# Patient Record
Sex: Male | Born: 1990 | Race: Black or African American | Hispanic: No | Marital: Single | Smoking: Never smoker
Health system: Southern US, Community
[De-identification: ages and names within clinical notes are randomized; demographics above are authoritative.]

## PROBLEM LIST (undated history)

## (undated) DIAGNOSIS — I514 Myocarditis, unspecified: Secondary | ICD-10-CM

## (undated) DIAGNOSIS — M109 Gout, unspecified: Secondary | ICD-10-CM

---

## 2020-09-23 ENCOUNTER — Emergency Department
Admission: EM | Admit: 2020-09-23 | Discharge: 2020-09-23 | Disposition: A | Discharge status: Home / Self Care | Payer: Medicaid - Out of State | Attending: Emergency Medicine | Admitting: Emergency Medicine

## 2020-09-23 ENCOUNTER — Encounter: Payer: Self-pay | Admitting: Emergency Medicine

## 2020-09-23 ENCOUNTER — Other Ambulatory Visit: Payer: Self-pay

## 2020-09-23 DIAGNOSIS — M109 Gout, unspecified: Secondary | ICD-10-CM | POA: Diagnosis not present

## 2020-09-23 DIAGNOSIS — M79671 Pain in right foot: Secondary | ICD-10-CM | POA: Diagnosis present

## 2020-09-23 LAB — URIC ACID: Uric Acid, Serum: 7.3 mg/dL (ref 3.7–8.6)

## 2020-09-23 MED ORDER — PREDNISONE 20 MG PO TABS
60.0000 mg | ORAL_TABLET | Freq: Once | ORAL | Status: AC
  Administered 2020-09-23: 60 mg via ORAL
  Filled 2020-09-23: qty 3

## 2020-09-23 MED ORDER — OXYCODONE-ACETAMINOPHEN 5-325 MG PO TABS
1.0000 | ORAL_TABLET | Freq: Once | ORAL | Status: AC
  Administered 2020-09-23: 1 via ORAL
  Filled 2020-09-23: qty 1

## 2020-09-23 MED ORDER — PREDNISONE 10 MG PO TABS: ORAL_TABLET | ORAL | 0 refills | Status: AC

## 2020-09-23 NOTE — ED Provider Notes (Signed)
[facility] Emergency Department Provider Note  ____________________________________________   Event Date/Time   First MD Initiated Contact with Patient 09/23/20 2003     (approximate)  I have reviewed the triage vital signs and the nursing notes.   HISTORY  Chief Complaint Foot Pain  HPI Marcus Atkins is a 30 y.o. male who presents to the emergency department for evaluation of right foot pain and swelling over the last 3 to 4 days.  Patient states that he has known history of gout, reports he has not had a gout attack in a few years.  He has allopurinol available that he has been taking without any improvement.  He reports that the colchicine has been prescribed to him but was too expensive.  He has also tried using the indomethacin that he has available at home without any significant improvement.  He denies any fevers or pain to any other joints.  He reports that this is a similar location of his previous gout attack.  Denies any trauma or falls to the area        History reviewed. No pertinent past medical history.  There are no problems to display for this patient.   History reviewed. No pertinent surgical history.  Prior to Admission medications   Medication Sig Start Date End Date Taking? Authorizing Provider  predniSONE (DELTASONE) 10 MG tablet Take 6 tablets (60 mg total) by mouth daily for 1 day, THEN 5 tablets (50 mg total) daily for 1 day, THEN 4 tablets (40 mg total) daily for 1 day, THEN 3 tablets (30 mg total) daily for 1 day, THEN 2 tablets (20 mg total) daily for 1 day, THEN 1 tablet (10 mg total) daily for 1 day. 09/23/20 09/29/20 Yes Lucy Chris, PA    Allergies Phenergan [promethazine]  No family history on file.  Social History Social History   Tobacco Use  . Smoking status: Never Smoker  . Smokeless tobacco: Never Used  Vaping Use  . Vaping Use: Never used  Substance Use Topics  . Alcohol use: Never  . Drug use: Never     Review of Systems Constitutional: No fever/chills Eyes: No visual changes. ENT: No sore throat. Cardiovascular: Denies chest pain. Respiratory: Denies shortness of breath. Gastrointestinal: No abdominal pain.  No nausea, no vomiting.  No diarrhea.  No constipation. Genitourinary: Negative for dysuria. Musculoskeletal: + Right foot pain and swelling, negative for back pain. Skin: Negative for rash. Neurological: Negative for headaches, focal weakness or numbness.   ____________________________________________   PHYSICAL EXAM:  VITAL SIGNS: ED Triage Vitals  Enc Vitals Group     BP 09/23/20 1916 119/80     Pulse Rate 09/23/20 1916 62     Resp 09/23/20 1916 19     Temp 09/23/20 1916 98.3 F (36.8 C)     Temp Source 09/23/20 1916 Oral     SpO2 09/23/20 1916 99 %     Weight 09/23/20 1908 (!) 320 lb (145.2 kg)     Height 09/23/20 1908 5\' 9"  (1.753 m)     Head Circumference --      Peak Flow --      Pain Score 09/23/20 1908 9     Pain Loc --      Pain Edu? --      Excl. in GC? --    Constitutional: Alert and oriented. Well appearing and in no acute distress. Eyes: Conjunctivae are normal. PERRL. EOMI. Head: Atraumatic. Nose: No congestion/rhinnorhea. Mouth/Throat: Mucous  membranes are moist.  Neck: No stridor.   Cardiovascular: Normal rate, regular rhythm. Grossly normal heart sounds.  Good peripheral circulation. Respiratory: Normal respiratory effort.  No retractions. Lungs CTAB. Musculoskeletal: There is mild swelling to the dorsum of the right foot with mild overlying erythema.  Significant tenderness to palpation.  No open wounds or findings concerning for infection.  Dorsal pedal pulse 2+ bilaterally, capillary refill less than 3 seconds all digits.  Full range of motion of the ankle. Neurologic:  Normal speech and language. No gross focal neurologic deficits are appreciated. No gait instability. Skin:  Skin is warm, dry and intact. No rash noted. Psychiatric:  Mood and affect are normal. Speech and behavior are normal.  ____________________________________________   LABS (all labs ordered are listed, but only abnormal results are displayed)  Labs Reviewed  URIC ACID   ____________________________________________   INITIAL IMPRESSION / ASSESSMENT AND PLAN / ED COURSE  As part of my medical decision making, I reviewed the following data within the electronic MEDICAL RECORD NUMBER Nursing notes reviewed and incorporated, Labs reviewed and Notes from prior ED visits        Patient is a 30 year old male who presents to the emergency department for evaluation of right foot pain and swelling over the last 3 to 4 days.  He reports he has history of gout and this feels similar to his previous.  He reports in the past he has had success with steroids, reports that the allopurinol is not helping him.  In triage, patient has normal vital signs.  On physical exam there is mild soft tissue swelling and erythema to the dorsum of the right foot with significant tenderness to this area.  He is neurologically intact.  Given his history of gout in the same location as well as no trauma, low suspicion for bony injury.  Uric acid mildly elevated at 7.3.  Will initiate treatment with steroids for probable gout attack.  Return precautions were discussed and patient stable this time for outpatient management.      ____________________________________________   FINAL CLINICAL IMPRESSION(S) / ED DIAGNOSES  Final diagnoses:  Acute gout of right foot, unspecified cause     ED Discharge Orders         Ordered    predniSONE (DELTASONE) 10 MG tablet        09/23/20 2050          *Please note:  Marcus Atkins was evaluated in Emergency Department on 09/23/2020 for the symptoms described in the history of present illness. He was evaluated in the context of the global COVID-19 pandemic, which necessitated consideration that the patient might be at risk for infection  with the SARS-CoV-2 virus that causes COVID-19. Institutional protocols and algorithms that pertain to the evaluation of patients at risk for COVID-19 are in a state of rapid change based on information released by regulatory bodies including the CDC and federal and state organizations. These policies and algorithms were followed during the patient's care in the ED.  Some ED evaluations and interventions may be delayed as a result of limited staffing during and the pandemic.*   Note:  This document was prepared using Dragon voice recognition software and may include unintentional dictation errors.   Lucy Chris, PA 09/23/20 9924    Sharyn Creamer, MD 09/24/20 [phone]

## 2020-09-23 NOTE — ED Triage Notes (Signed)
Pt reports gout flare-up to rt foot x 3-4 days ; currently taking allopurinol without relief

## 2020-09-23 NOTE — Discharge Instructions (Addendum)
Please take steroid taper as prescribed. Take Tylenol, up to 1000mg  4x daily as needed for pain. Return to ER with any worsening.

## 2020-10-01 ENCOUNTER — Emergency Department (HOSPITAL_BASED_OUTPATIENT_CLINIC_OR_DEPARTMENT_OTHER): Payer: No Typology Code available for payment source

## 2020-10-01 ENCOUNTER — Emergency Department (HOSPITAL_COMMUNITY): Payer: No Typology Code available for payment source

## 2020-10-01 ENCOUNTER — Inpatient Hospital Stay (HOSPITAL_COMMUNITY)
Admission: EM | Admit: 2020-10-01 | Discharge: 2020-10-04 | DRG: 291 | Disposition: A | Discharge status: Home / Self Care | Payer: No Typology Code available for payment source | Attending: Internal Medicine | Admitting: Internal Medicine

## 2020-10-01 ENCOUNTER — Other Ambulatory Visit: Payer: Self-pay

## 2020-10-01 ENCOUNTER — Encounter (HOSPITAL_COMMUNITY): Payer: Self-pay

## 2020-10-01 DIAGNOSIS — M1A9XX0 Chronic gout, unspecified, without tophus (tophi): Secondary | ICD-10-CM | POA: Diagnosis present

## 2020-10-01 DIAGNOSIS — R042 Hemoptysis: Secondary | ICD-10-CM | POA: Diagnosis present

## 2020-10-01 DIAGNOSIS — G8929 Other chronic pain: Secondary | ICD-10-CM | POA: Diagnosis present

## 2020-10-01 DIAGNOSIS — Z9119 Patient's noncompliance with other medical treatment and regimen: Secondary | ICD-10-CM

## 2020-10-01 DIAGNOSIS — M109 Gout, unspecified: Secondary | ICD-10-CM

## 2020-10-01 DIAGNOSIS — R0602 Shortness of breath: Secondary | ICD-10-CM | POA: Diagnosis not present

## 2020-10-01 DIAGNOSIS — M7989 Other specified soft tissue disorders: Secondary | ICD-10-CM | POA: Diagnosis present

## 2020-10-01 DIAGNOSIS — R7989 Other specified abnormal findings of blood chemistry: Secondary | ICD-10-CM | POA: Diagnosis present

## 2020-10-01 DIAGNOSIS — R001 Bradycardia, unspecified: Secondary | ICD-10-CM | POA: Diagnosis not present

## 2020-10-01 DIAGNOSIS — Z7901 Long term (current) use of anticoagulants: Secondary | ICD-10-CM

## 2020-10-01 DIAGNOSIS — Z20822 Contact with and (suspected) exposure to covid-19: Secondary | ICD-10-CM | POA: Diagnosis present

## 2020-10-01 DIAGNOSIS — M79604 Pain in right leg: Secondary | ICD-10-CM | POA: Diagnosis present

## 2020-10-01 DIAGNOSIS — E119 Type 2 diabetes mellitus without complications: Secondary | ICD-10-CM

## 2020-10-01 DIAGNOSIS — E876 Hypokalemia: Secondary | ICD-10-CM | POA: Diagnosis present

## 2020-10-01 DIAGNOSIS — I513 Intracardiac thrombosis, not elsewhere classified: Secondary | ICD-10-CM | POA: Diagnosis present

## 2020-10-01 DIAGNOSIS — M79605 Pain in left leg: Secondary | ICD-10-CM | POA: Diagnosis present

## 2020-10-01 DIAGNOSIS — Z79899 Other long term (current) drug therapy: Secondary | ICD-10-CM

## 2020-10-01 DIAGNOSIS — Z8616 Personal history of COVID-19: Secondary | ICD-10-CM

## 2020-10-01 DIAGNOSIS — I5043 Acute on chronic combined systolic (congestive) and diastolic (congestive) heart failure: Secondary | ICD-10-CM | POA: Diagnosis present

## 2020-10-01 DIAGNOSIS — Z6841 Body Mass Index (BMI) 40.0 and over, adult: Secondary | ICD-10-CM

## 2020-10-01 DIAGNOSIS — J129 Viral pneumonia, unspecified: Secondary | ICD-10-CM | POA: Diagnosis present

## 2020-10-01 DIAGNOSIS — R778 Other specified abnormalities of plasma proteins: Secondary | ICD-10-CM | POA: Diagnosis present

## 2020-10-01 DIAGNOSIS — E1165 Type 2 diabetes mellitus with hyperglycemia: Secondary | ICD-10-CM

## 2020-10-01 DIAGNOSIS — I110 Hypertensive heart disease with heart failure: Secondary | ICD-10-CM | POA: Diagnosis not present

## 2020-10-01 DIAGNOSIS — Z888 Allergy status to other drugs, medicaments and biological substances status: Secondary | ICD-10-CM

## 2020-10-01 HISTORY — DX: Myocarditis, unspecified: I51.4

## 2020-10-01 HISTORY — DX: Gout, unspecified: M10.9

## 2020-10-01 LAB — RESP PANEL BY RT-PCR (FLU A&B, COVID) ARPGX2
Influenza A by PCR: NEGATIVE
Influenza B by PCR: NEGATIVE
SARS Coronavirus 2 by RT PCR: NEGATIVE

## 2020-10-01 LAB — CBC WITH DIFFERENTIAL/PLATELET
Abs Immature Granulocytes: 0.07 10*3/uL (ref 0.00–0.07)
Basophils Absolute: 0 10*3/uL (ref 0.0–0.1)
Basophils Relative: 0 %
Eosinophils Absolute: 0.1 10*3/uL (ref 0.0–0.5)
Eosinophils Relative: 1 %
HCT: 43.8 % (ref 39.0–52.0)
Hemoglobin: 13.6 g/dL (ref 13.0–17.0)
Immature Granulocytes: 1 %
Lymphocytes Relative: 26 %
Lymphs Abs: 3 10*3/uL (ref 0.7–4.0)
MCH: 26.8 pg (ref 26.0–34.0)
MCHC: 31.1 g/dL (ref 30.0–36.0)
MCV: 86.2 fL (ref 80.0–100.0)
Monocytes Absolute: 0.9 10*3/uL (ref 0.1–1.0)
Monocytes Relative: 8 %
Neutro Abs: 7.3 10*3/uL (ref 1.7–7.7)
Neutrophils Relative %: 64 %
Platelets: 294 10*3/uL (ref 150–400)
RBC: 5.08 MIL/uL (ref 4.22–5.81)
RDW: 17.4 % — ABNORMAL HIGH (ref 11.5–15.5)
WBC: 11.3 10*3/uL — ABNORMAL HIGH (ref 4.0–10.5)
nRBC: 0 % (ref 0.0–0.2)

## 2020-10-01 LAB — BASIC METABOLIC PANEL
Anion gap: 8 (ref 5–15)
BUN: 11 mg/dL (ref 6–20)
CO2: 27 mmol/L (ref 22–32)
Calcium: 9.1 mg/dL (ref 8.9–10.3)
Chloride: 108 mmol/L (ref 98–111)
Creatinine, Ser: 1.19 mg/dL (ref 0.61–1.24)
GFR, Estimated: >60 mL/min (ref >60)
Glucose, Bld: 153 mg/dL — ABNORMAL HIGH (ref 70–99)
Potassium: 3.1 mmol/L — ABNORMAL LOW (ref 3.5–5.1)
Sodium: 143 mmol/L (ref 135–145)

## 2020-10-01 LAB — TROPONIN I (HIGH SENSITIVITY)
Troponin I (High Sensitivity): 129 ng/L (ref <18)
Troponin I (High Sensitivity): 125 ng/L (ref <18)

## 2020-10-01 LAB — BRAIN NATRIURETIC PEPTIDE: B Natriuretic Peptide: 127.4 pg/mL — ABNORMAL HIGH (ref 0.0–100.0)

## 2020-10-01 LAB — D-DIMER, QUANTITATIVE: D-Dimer, Quant: 0.64 ug/mL-FEU — ABNORMAL HIGH (ref 0.00–0.50)

## 2020-10-01 MED ORDER — IOHEXOL 350 MG/ML SOLN
80.0000 mL | Freq: Once | INTRAVENOUS | Status: AC | PRN
  Administered 2020-10-01: 80 mL via INTRAVENOUS

## 2020-10-01 MED ORDER — POTASSIUM CHLORIDE CRYS ER 20 MEQ PO TBCR
40.0000 meq | EXTENDED_RELEASE_TABLET | Freq: Once | ORAL | Status: AC
  Administered 2020-10-01: 40 meq via ORAL
  Filled 2020-10-01: qty 2

## 2020-10-01 MED ORDER — ALLOPURINOL 300 MG PO TABS
300.0000 mg | ORAL_TABLET | Freq: Two times a day (BID) | ORAL | Status: DC
  Administered 2020-10-02 – 2020-10-04 (×6): 300 mg via ORAL
  Filled 2020-10-01 (×6): qty 1

## 2020-10-01 MED ORDER — PANTOPRAZOLE SODIUM 40 MG PO TBEC: 40.0000 mg | DELAYED_RELEASE_TABLET | Freq: Every day | ORAL | Status: DC | PRN

## 2020-10-01 MED ORDER — ENOXAPARIN SODIUM 60 MG/0.6ML IJ SOSY
60.0000 mg | PREFILLED_SYRINGE | INTRAMUSCULAR | Status: DC
  Administered 2020-10-02: 60 mg via SUBCUTANEOUS
  Filled 2020-10-01: qty 0.6

## 2020-10-01 MED ORDER — SODIUM CHLORIDE (PF) 0.9 % IJ SOLN
INTRAMUSCULAR | Status: AC
  Filled 2020-10-01: qty 50

## 2020-10-01 MED ORDER — ACETAMINOPHEN 500 MG PO TABS
1000.0000 mg | ORAL_TABLET | Freq: Once | ORAL | Status: AC
  Administered 2020-10-01: 1000 mg via ORAL
  Filled 2020-10-01: qty 2

## 2020-10-01 MED ORDER — FUROSEMIDE 10 MG/ML IJ SOLN
20.0000 mg | Freq: Every day | INTRAMUSCULAR | Status: DC
  Administered 2020-10-02: 20 mg via INTRAVENOUS
  Filled 2020-10-01: qty 2
  Filled 2020-10-01: qty 4

## 2020-10-01 MED ORDER — HYDROCODONE-ACETAMINOPHEN 5-325 MG PO TABS
1.0000 | ORAL_TABLET | Freq: Four times a day (QID) | ORAL | Status: DC | PRN
Start: 2020-10-01 — End: 2020-10-04
  Administered 2020-10-02 – 2020-10-03 (×5): 1 via ORAL
  Filled 2020-10-01 (×5): qty 1

## 2020-10-01 MED ORDER — INSULIN ASPART 100 UNIT/ML IJ SOLN
0.0000 [IU] | Freq: Three times a day (TID) | INTRAMUSCULAR | Status: DC
  Administered 2020-10-02: 8 [IU] via SUBCUTANEOUS
  Administered 2020-10-02: 5 [IU] via SUBCUTANEOUS
  Administered 2020-10-02: 8 [IU] via SUBCUTANEOUS
  Administered 2020-10-02: 15 [IU] via SUBCUTANEOUS
  Administered 2020-10-02 – 2020-10-03 (×3): 8 [IU] via SUBCUTANEOUS
  Administered 2020-10-03 (×2): 5 [IU] via SUBCUTANEOUS
  Administered 2020-10-03 – 2020-10-04 (×2): 3 [IU] via SUBCUTANEOUS
  Administered 2020-10-04 (×2): 5 [IU] via SUBCUTANEOUS
  Filled 2020-10-01: qty 0.15

## 2020-10-01 MED ORDER — ASPIRIN 81 MG PO CHEW
324.0000 mg | CHEWABLE_TABLET | Freq: Once | ORAL | Status: AC
  Administered 2020-10-01: 324 mg via ORAL
  Filled 2020-10-01: qty 4

## 2020-10-01 MED ORDER — KETOROLAC TROMETHAMINE 15 MG/ML IJ SOLN
15.0000 mg | Freq: Once | INTRAMUSCULAR | Status: AC
  Administered 2020-10-01: 15 mg via INTRAVENOUS
  Filled 2020-10-01: qty 1

## 2020-10-01 MED ORDER — PREDNISONE 20 MG PO TABS
40.0000 mg | ORAL_TABLET | Freq: Every day | ORAL | Status: DC
  Administered 2020-10-02 – 2020-10-04 (×3): 40 mg via ORAL
  Filled 2020-10-01 (×4): qty 2

## 2020-10-01 MED ORDER — CARVEDILOL 25 MG PO TABS
25.0000 mg | ORAL_TABLET | Freq: Two times a day (BID) | ORAL | Status: DC
  Administered 2020-10-02 – 2020-10-03 (×5): 25 mg via ORAL
  Filled 2020-10-01: qty 1
  Filled 2020-10-01: qty 2
  Filled 2020-10-01 (×3): qty 1

## 2020-10-01 MED ORDER — ATORVASTATIN CALCIUM 40 MG PO TABS
80.0000 mg | ORAL_TABLET | Freq: Every day | ORAL | Status: DC
  Administered 2020-10-02 – 2020-10-04 (×3): 80 mg via ORAL
  Filled 2020-10-01 (×4): qty 2

## 2020-10-01 NOTE — ED Notes (Signed)
Failed attempt at iv x3; awaiting [place] RN to attempt. Pt states he always has difficult veins.

## 2020-10-01 NOTE — ED Provider Notes (Signed)
[facility]-EMERGENCY DEPT Provider Note   CSN: [id number] Arrival date & time: 10/01/20  1453     History Chief Complaint  Patient presents with  . Fatigue  . Hemoptysis  . Foot Swelling    Marcus Atkins is a 30 y.o. male.  Generalized fatigue, body aches.  Has had a cough for the last few days.  Congestion.  Had some hemoptysis.  Bilateral lower extremity swelling.  History of COVID myocarditis.  Compliant with his medications most of the time for high blood pressure and gout.  Also has swelling of his left elbow, he thinks it is a gout flare.  He denies fevers.  Tolerating p.o.        Past Medical History:  Diagnosis Date  . Gout   . Myocarditis (HCC)     There are no problems to display for this patient.   History reviewed. No pertinent surgical history.     Family History  Family history unknown: Yes    Social History   Tobacco Use  . Smoking status: Never Smoker  . Smokeless tobacco: Never Used  Vaping Use  . Vaping Use: Never used  Substance Use Topics  . Alcohol use: Never  . Drug use: Never    Home Medications Prior to Admission medications   Not on File    Allergies    Phenergan [promethazine]  Review of Systems   Review of Systems  Constitutional: Positive for fatigue. Negative for chills and fever.  HENT: Positive for congestion and rhinorrhea.   Respiratory: Positive for cough. Negative for shortness of breath.   Cardiovascular: Positive for leg swelling. Negative for chest pain and palpitations.  Gastrointestinal: Negative for diarrhea, nausea and vomiting.  Genitourinary: Negative for difficulty urinating and dysuria.  Musculoskeletal: Positive for arthralgias and joint swelling. Negative for back pain.  Skin: Negative for color change and rash.  Neurological: Negative for light-headedness and headaches.    Physical Exam Updated Vital Signs BP (!) 131/108   Pulse (!) 115   Temp 99 F (37.2 C) (Oral)    Resp (!) 27   Ht 5\' 9"  (1.753 m)   Wt (!) 145.2 kg   SpO2 94%   BMI 47.26 kg/m   Physical Exam Vitals and nursing note reviewed.  Constitutional:      General: He is not in acute distress.    Appearance: Normal appearance.  HENT:     Head: Normocephalic and atraumatic.     Nose: Congestion present. No rhinorrhea.  Eyes:     General:        Right eye: No discharge.        Left eye: No discharge.     Conjunctiva/sclera: Conjunctivae normal.  Cardiovascular:     Rate and Rhythm: Regular rhythm. Tachycardia present.  Pulmonary:     Effort: Pulmonary effort is normal.     Breath sounds: No stridor.  Abdominal:     General: Abdomen is flat. There is no distension.     Palpations: Abdomen is soft.     Tenderness: There is no abdominal tenderness.  Musculoskeletal:        General: Swelling and tenderness present. No deformity or signs of injury.     Right lower leg: Edema present.     Left lower leg: Edema present.     Comments: 2+ pitting edema bilateral lower extremities right greater than left.  Erythema and tenderness to palpation of the left elbow, patient is able to extend and flex  and pull his weight up from sitting with  Skin:    General: Skin is warm and dry.  Neurological:     General: No focal deficit present.     Mental Status: He is alert. Mental status is at baseline.     Motor: No weakness.  Psychiatric:        Mood and Affect: Mood normal.        Behavior: Behavior normal.        Thought Content: Thought content normal.     ED Results / Procedures / Treatments   Labs (all labs ordered are listed, but only abnormal results are displayed) Labs Reviewed  BASIC METABOLIC PANEL - Abnormal; Notable for the following components:      Result Value   Potassium 3.1 (*)    Glucose, Bld 153 (*)    All other components within normal limits  BRAIN NATRIURETIC PEPTIDE - Abnormal; Notable for the following components:   B Natriuretic Peptide 127.4 (*)    All other  components within normal limits  D-DIMER, QUANTITATIVE - Abnormal; Notable for the following components:   D-Dimer, Quant 0.64 (*)    All other components within normal limits  CBC WITH DIFFERENTIAL/PLATELET - Abnormal; Notable for the following components:   WBC 11.3 (*)    RDW 17.4 (*)    All other components within normal limits  TROPONIN I (HIGH SENSITIVITY) - Abnormal; Notable for the following components:   Troponin I (High Sensitivity) 129 (*)    All other components within normal limits  TROPONIN I (HIGH SENSITIVITY) - Abnormal; Notable for the following components:   Troponin I (High Sensitivity) 125 (*)    All other components within normal limits  RESP PANEL BY RT-PCR (FLU A&B, COVID) [id number]    EKG EKG Interpretation  Date/Time:  Friday October 01 2020 15:57:47 EDT Ventricular Rate:  122 PR Interval:  115 QRS Duration: 102 QT Interval:  319 QTC Calculation: 455 R Axis:   -4 Text Interpretation: Sinus tachycardia Atrial premature complexes Consider right atrial enlargement Probable LVH with secondary repol abnrm Anterior ST elevation, probably due to LVH Confirmed by Cherlynn Perches ([id number]) on 10/01/2020 4:54:01 PM   Radiology DG Chest 2 View  Result Date: 10/01/2020 CLINICAL DATA:  Fatigue. EXAM: CHEST - 2 VIEW COMPARISON:  None. FINDINGS: Decreased lung volumes are seen which may be secondary to suboptimal patient inspiration. Increased bronchovascular lung markings are seen within the bilateral lung bases. There is no evidence of a pleural effusion or pneumothorax. The cardiac silhouette is mildly enlarged. The visualized skeletal structures are unremarkable. IMPRESSION: Low lung volumes with bibasilar bronchovascular crowding. Correlation with follow-up chest plain film with improved patient inspiration is recommended if clinical symptoms persist. Electronically Signed   By: Aram Candela M.D.   On: 10/01/2020 16:11   CT Angio Chest PE W and/or Wo Contrast  Result Date:  10/01/2020 CLINICAL DATA:  PE suspected, shortness of breath, hemoptysis EXAM: CT ANGIOGRAPHY CHEST WITH CONTRAST TECHNIQUE: Multidetector CT imaging of the chest was performed using the standard protocol during bolus administration of intravenous contrast. Multiplanar CT image reconstructions and MIPs were obtained to evaluate the vascular anatomy. CONTRAST:  71mL OMNIPAQUE IOHEXOL 350 MG/ML SOLN COMPARISON:  None. FINDINGS: Cardiovascular: Satisfactory opacification of the pulmonary arteries to the segmental level. No evidence of pulmonary embolism. Cardiomegaly. No pericardial effusion. Mediastinum/Nodes: No enlarged mediastinal, hilar, or axillary lymph nodes. Thyroid gland, trachea, and esophagus demonstrate no significant findings. Lungs/Pleura: Extensive bilateral heterogeneous and ground-glass  airspace opacity throughout the lungs, with a bibasilar predominance. No pleural effusion or pneumothorax. Upper Abdomen: No acute abnormality. Musculoskeletal: No chest wall abnormality. No acute or significant osseous findings. Review of the MIP images confirms the above findings. IMPRESSION: 1. Negative examination for pulmonary embolism. 2. Extensive bilateral heterogeneous and ground-glass airspace opacity throughout the lungs, with a bibasilar predominance. Findings are consistent with multifocal infection, including COVID-19 if clinically suspected. 3. Cardiomegaly. Electronically Signed   By: Lauralyn Primes M.D.   On: 10/01/2020 19:54   VAS [place] LOWER EXTREMITY VENOUS (DVT) (ONLY [facility] & [facility] 7a-7p)  Result Date: 10/01/2020  Lower Venous DVT Study Patient Name:  Marcus Atkins  Date of Exam:   10/01/2020 Medical Rec #: [id number]    Accession #:    [id number] Date of Birth: 31-May-1990    Patient Gender: M Patient Age:   30Y Exam Location:  [facility] Procedure:      VAS [place] LOWER EXTREMITY VENOUS (DVT) Referring Phys: [id number] BOWIE TRAN --------------------------------------------------------------------------------   Indications: Swelling, and SOB.  Comparison Study: No prior. Performing Technologist: Marilynne Halsted RDMS, RVT  Examination Guidelines: A complete evaluation includes B-mode imaging, spectral Doppler, color Doppler, and power Doppler as needed of all accessible portions of each vessel. Bilateral testing is considered an integral part of a complete examination. Limited examinations for reoccurring indications may be performed as noted. The reflux portion of the exam is performed with the patient in reverse Trendelenburg.  +---------+---------------+---------+-----------+----------+--------------+ RIGHT    CompressibilityPhasicitySpontaneityPropertiesThrombus Aging +---------+---------------+---------+-----------+----------+--------------+ CFV      Full           Yes      Yes                                 +---------+---------------+---------+-----------+----------+--------------+ SFJ      Full                                                        +---------+---------------+---------+-----------+----------+--------------+ FV Prox  Full                                                        +---------+---------------+---------+-----------+----------+--------------+ FV Mid   Full                                                        +---------+---------------+---------+-----------+----------+--------------+ FV DistalFull                                                        +---------+---------------+---------+-----------+----------+--------------+ PFV      Full                                                        +---------+---------------+---------+-----------+----------+--------------+  POP      Full           Yes      Yes                                 +---------+---------------+---------+-----------+----------+--------------+ PTV      Full                                                         +---------+---------------+---------+-----------+----------+--------------+ PERO     Full                                                        +---------+---------------+---------+-----------+----------+--------------+   +----+---------------+---------+-----------+----------+--------------+ LEFTCompressibilityPhasicitySpontaneityPropertiesThrombus Aging +----+---------------+---------+-----------+----------+--------------+ CFV Full           Yes      Yes                                 +----+---------------+---------+-----------+----------+--------------+ SFJ Full                                                        +----+---------------+---------+-----------+----------+--------------+     Summary: RIGHT: - There is no evidence of deep vein thrombosis in the lower extremity.  - No cystic structure found in the popliteal fossa.  LEFT: - No evidence of common femoral vein obstruction.  *See table(s) above for measurements and observations.    Preliminary     Procedures Procedures   Medications Ordered in ED Medications  acetaminophen (TYLENOL) tablet 1,000 mg (1,000 mg Oral Given 10/01/20 1656)  ketorolac (TORADOL) 15 MG/ML injection 15 mg (15 mg Intravenous Given 10/01/20 1652)  sodium chloride (PF) 0.9 % injection (  Given by Other 10/01/20 1933)  aspirin chewable tablet 324 mg (324 mg Oral Given 10/01/20 1954)  iohexol (OMNIPAQUE) 350 MG/ML injection 80 mL (80 mLs Intravenous Contrast Given 10/01/20 1933)    ED Course  I have reviewed the triage vital signs and the nursing notes.  Pertinent labs & imaging results that were available during my care of the patient were reviewed by me and considered in my medical decision making (see chart for details).    MDM Rules/Calculators/A&P                          Patient here with flulike symptoms, history of COVID myocarditis.  Will get cardiac screening.  EKG shows tachycardia with atrial premature complex.  Some signs of left  ventricular hypertrophy.  He does have a history of hypertension COVID myocarditis.  He takes his medications fairly reliably.  He is afebrile here.  He will get screening labs, he got ultrasound of his lower extremities there is no DVT.  D-dimer is elevated he will need CT PE study.  Bili otherwise unremarkable.  Patient troponin is stable, no significant change after repeated.  CT PE study was negative.  Groundglass opacities consistent with viral illness such as COVID no COVID test negative.  May need repeat testing.  Patient is having leg swelling in the setting of what appears to be myocardial irritation.  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  Will recommend admission for observation. Final Clinical Impression(s) / ED Diagnoses Final diagnoses:  Elevated troponin  Leg swelling  Hemoptysis    Rx / DC Orders ED Discharge Orders    None       Sabino Donovan, MD 10/01/20 2037

## 2020-10-01 NOTE — Progress Notes (Signed)
Right lower ext venous  has been completed. Refer to [facility] under chart review to view preliminary results.   10/01/2020  4:19 PM Jeffifer Rabold, Gerarda Gunther

## 2020-10-01 NOTE — H&P (Addendum)
History and Physical    Marcus Atkins WUJ:[id number] DOB: 10-02-1990 DOA: 10/01/2020  PCP: Pcp, No  Patient coming from: Home  I have personally briefly reviewed patient's old medical records in [facility]  Chief Complaint:   HPI: Marcus ChancellorCordez Quilling is a 30 y.o. male with medical history significant for myocarditis, chronic systolic HF, Type 2 DM, complex region pain,COVID in 2020, gout, hx of DVT on Xarelto and obesity who presents with worsening LE edema.  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 LE edema started a few days ago worse on the right. Also had cough and mild shortness of breath worse with exertion. No chest pain. No nausea vomiting and diarrhea.  No fever.  Also notes chronic gout and recently had a flareup to his left elbow.  Patient has history of myocarditis back in March 2020 with reportedly negative COVID test.  States he almost had to wear a cardiac vest at one point due to low EF back in [place].  ED Course: He had temp of 53F, tachycardic and tachypneic but stable on room air. WBC OF 11.3. K of 3.1. Troponin of 129 to 125. EKG with LVH.   Negative COVID and flu test.  Negative bilateral DVT venous doppler CTA with basilar ground glass opacity.  Review of Systems:  Constitutional: No Weight Change, No Fever ENT/Mouth: No sore throat, No Rhinorrhea Eyes: No Eye Pain, No Vision Changes Cardiovascular: No Chest Pain,+ SOB, No PND, + Dyspnea on Exertion, No Orthopnea, No Claudication, + Edema, No Palpitations Respiratory: + Cough, No Sputum, No Wheezing, + Dyspnea  Gastrointestinal: No Nausea, No Vomiting, No Diarrhea, No Constipation, No Pain Genitourinary: no Urinary Incontinence Musculoskeletal: No Arthralgias, No Myalgias Skin: No Skin Lesions, No Pruritus, Neuro: no Weakness, No Numbness Psych: No Anxiety/Panic, No Depression, no decrease appetite Heme/Lymph: No Bruising, No Bleeding  Past Medical History:  Diagnosis Date  . Gout    . Myocarditis (HCC)     History reviewed. No pertinent surgical history.   reports that he has never smoked. He has never used smokeless tobacco. He reports that he does not drink alcohol and does not use drugs. Social History  Allergies  Allergen Reactions  . Phenergan [Promethazine]   . Tramadol Itching    Family History  Family history unknown: Yes     Prior to Admission medications   Medication Sig Start Date End Date Taking? Authorizing Provider  allopurinol (ZYLOPRIM) 300 MG tablet Take 300 mg by mouth 2 (two) times daily.   Yes [provider]  atorvastatin (LIPITOR) 80 MG tablet Take 1 tablet by mouth daily. 08/13/20  Yes [provider]  carvedilol (COREG) 25 MG tablet Take 25 mg by mouth 2 (two) times daily. 08/11/20  Yes [provider]  fluticasone (FLONASE) 50 MCG/ACT nasal spray Place 1 spray into both nostrils daily as needed. 08/19/20  Yes [provider]  HYDROcodone-acetaminophen (NORCO/VICODIN) 5-325 MG tablet Take 1 tablet by mouth every 6 (six) hours as needed for severe pain.   Yes [provider]  loratadine (CLARITIN) 10 MG tablet Take 10 mg by mouth daily as needed for allergies. 08/19/20  Yes [provider]  naproxen (NAPROSYN) 500 MG tablet Take 500 mg by mouth 2 (two) times daily as needed for moderate pain. 08/16/20  Yes [provider]  pantoprazole (PROTONIX) 40 MG tablet Take 40 mg by mouth daily as needed (heartburn). 05/06/20  Yes [provider]  rivaroxaban (  XARELTO) 20 MG TABS tablet Take 20 mg by mouth daily.   Yes [provider]  Semaglutide, 1 MG/DOSE, (OZEMPIC, 1 MG/DOSE,) 4 MG/3ML SOPN Inject 1 mg into the skin once a week. Mondays   Yes [provider]  predniSONE (STERAPRED UNI-PAK 21 TAB) 10 MG (21) TBPK tablet Take 10 mg by mouth daily. 09/24/20   [provider]    Physical Exam: Vitals:   10/01/20 1843 10/01/20 1900 10/01/20 1945 10/01/20  2000  BP: (!) 130/94 (!) 130/95 (!) 126/102 (!) 131/108  Pulse: (!) 114 (!) 54 (!) 115 (!) 115  Resp: (!) 22 (!) 34 (!) 23 (!) 27  Temp:      TempSrc:      SpO2: 96% 93% 95% 94%  Weight:      Height:        Constitutional: NAD, calm, comfortable, morbidly obese male who laying at 30 degree incline in bed Vitals:   10/01/20 1843 10/01/20 1900 10/01/20 1945 10/01/20 2000  BP: (!) 130/94 (!) 130/95 (!) 126/102 (!) 131/108  Pulse: (!) 114 (!) 54 (!) 115 (!) 115  Resp: (!) 22 (!) 34 (!) 23 (!) 27  Temp:      TempSrc:      SpO2: 96% 93% 95% 94%  Weight:      Height:       Eyes: PERRL, lids and conjunctivae normal ENMT: Mucous membranes are moist.  Neck: normal, supple Respiratory: clear to auscultation bilaterally, no wheezing, no crackles. Normal respiratory effort. No accessory muscle use.  Cardiovascular: Tachycardia, no murmurs / rubs / gallops. + 3 pitting edema bilateral lower extremity up to mid pretibial region  abdomen: no tenderness, no masses palpated.  Bowel sounds positive.  Musculoskeletal: no clubbing / cyanosis.  Erythema and warmth to palpation of the left elbow joint. Skin: no rashes, lesions, ulcers. No induration Neurologic: CN 2-12 grossly intact. Sensation intact,  Strength 5/5 in all 4.  Psychiatric: Normal judgment and insight. Alert and oriented x 3. Normal mood.     Labs on Admission: I have personally reviewed following labs and imaging studies  CBC: Recent Labs  Lab 10/01/20 1635  WBC 11.3*  NEUTROABS 7.3  HGB 13.6  HCT 43.8  MCV 86.2  PLT 294   Basic Metabolic Panel: Recent Labs  Lab 10/01/20 1515  NA 143  K 3.1*  CL 108  CO2 27  GLUCOSE 153*  BUN 11  CREATININE 1.19  CALCIUM 9.1   GFR: Estimated Creatinine Clearance: 126.7 mL/min (by C-G formula based on SCr of 1.19 mg/dL). Liver Function Tests: No results for input(s): AST, ALT, ALKPHOS, BILITOT, PROT, ALBUMIN in the last 168 hours. No results for input(s): LIPASE, AMYLASE in  the last 168 hours. No results for input(s): AMMONIA in the last 168 hours. Coagulation Profile: No results for input(s): INR, PROTIME in the last 168 hours. Cardiac Enzymes: No results for input(s): CKTOTAL, CKMB, CKMBINDEX, TROPONINI in the last 168 hours. BNP (last 3 results) No results for input(s): PROBNP in the last 8760 hours. HbA1C: No results for input(s): HGBA1C in the last 72 hours. CBG: No results for input(s): GLUCAP in the last 168 hours. Lipid Profile: No results for input(s): CHOL, HDL, LDLCALC, TRIG, CHOLHDL, LDLDIRECT in the last 72 hours. Thyroid Function Tests: No results for input(s): TSH, T4TOTAL, FREET4, T3FREE, THYROIDAB in the last 72 hours. Anemia Panel: No results for input(s): VITAMINB12, FOLATE, FERRITIN, TIBC, IRON, RETICCTPCT in the last 72 hours. Urine analysis: No results  found for: COLORURINE, APPEARANCEUR, LABSPEC, PHURINE, GLUCOSEU, HGBUR, BILIRUBINUR, KETONESUR, PROTEINUR, UROBILINOGEN, NITRITE, LEUKOCYTESUR  Radiological Exams on Admission: DG Chest 2 View  Result Date: 10/01/2020 CLINICAL DATA:  Fatigue. EXAM: CHEST - 2 VIEW COMPARISON:  None. FINDINGS: Decreased lung volumes are seen which may be secondary to suboptimal patient inspiration. Increased bronchovascular lung markings are seen within the bilateral lung bases. There is no evidence of a pleural effusion or pneumothorax. The cardiac silhouette is mildly enlarged. The visualized skeletal structures are unremarkable. IMPRESSION: Low lung volumes with bibasilar bronchovascular crowding. Correlation with follow-up chest plain film with improved patient inspiration is recommended if clinical symptoms persist. Electronically Signed   By: Aram Candela M.D.   On: 10/01/2020 16:11   CT Angio Chest PE W and/or Wo Contrast  Result Date: 10/01/2020 CLINICAL DATA:  PE suspected, shortness of breath, hemoptysis EXAM: CT ANGIOGRAPHY CHEST WITH CONTRAST TECHNIQUE: Multidetector CT imaging of the chest  was performed using the standard protocol during bolus administration of intravenous contrast. Multiplanar CT image reconstructions and MIPs were obtained to evaluate the vascular anatomy. CONTRAST:  64mL OMNIPAQUE IOHEXOL 350 MG/ML SOLN COMPARISON:  None. FINDINGS: Cardiovascular: Satisfactory opacification of the pulmonary arteries to the segmental level. No evidence of pulmonary embolism. Cardiomegaly. No pericardial effusion. Mediastinum/Nodes: No enlarged mediastinal, hilar, or axillary lymph nodes. Thyroid gland, trachea, and esophagus demonstrate no significant findings. Lungs/Pleura: Extensive bilateral heterogeneous and ground-glass airspace opacity throughout the lungs, with a bibasilar predominance. No pleural effusion or pneumothorax. Upper Abdomen: No acute abnormality. Musculoskeletal: No chest wall abnormality. No acute or significant osseous findings. Review of the MIP images confirms the above findings. IMPRESSION: 1. Negative examination for pulmonary embolism. 2. Extensive bilateral heterogeneous and ground-glass airspace opacity throughout the lungs, with a bibasilar predominance. Findings are consistent with multifocal infection, including COVID-19 if clinically suspected. 3. Cardiomegaly. Electronically Signed   By: Lauralyn Primes M.D.   On: 10/01/2020 19:54   VAS [place] LOWER EXTREMITY VENOUS (DVT) (ONLY [facility] & [facility] 7a-7p)  Result Date: 10/01/2020  Lower Venous DVT Study Patient Name:  TALLIN HART  Date of Exam:   10/01/2020 Medical Rec #: [id number]    Accession #:    [id number] Date of Birth: [birth year]    Patient Gender: M Patient Age:   30Y Exam Location:  [facility] Procedure:      VAS [place] LOWER EXTREMITY VENOUS (DVT) Referring Phys: [id number] BOWIE TRAN --------------------------------------------------------------------------------  Indications: Swelling, and SOB.  Comparison Study: No prior. Performing Technologist: Marilynne Halsted RDMS, RVT  Examination Guidelines: A complete evaluation  includes B-mode imaging, spectral Doppler, color Doppler, and power Doppler as needed of all accessible portions of each vessel. Bilateral testing is considered an integral part of a complete examination. Limited examinations for reoccurring indications may be performed as noted. The reflux portion of the exam is performed with the patient in reverse Trendelenburg.  +---------+---------------+---------+-----------+----------+--------------+ RIGHT    CompressibilityPhasicitySpontaneityPropertiesThrombus Aging +---------+---------------+---------+-----------+----------+--------------+ CFV      Full           Yes      Yes                                 +---------+---------------+---------+-----------+----------+--------------+ SFJ      Full                                                        +---------+---------------+---------+-----------+----------+--------------+  FV Prox  Full                                                        +---------+---------------+---------+-----------+----------+--------------+ FV Mid   Full                                                        +---------+---------------+---------+-----------+----------+--------------+ FV DistalFull                                                        +---------+---------------+---------+-----------+----------+--------------+ PFV      Full                                                        +---------+---------------+---------+-----------+----------+--------------+ POP      Full           Yes      Yes                                 +---------+---------------+---------+-----------+----------+--------------+ PTV      Full                                                        +---------+---------------+---------+-----------+----------+--------------+ PERO     Full                                                         +---------+---------------+---------+-----------+----------+--------------+   +----+---------------+---------+-----------+----------+--------------+ LEFTCompressibilityPhasicitySpontaneityPropertiesThrombus Aging +----+---------------+---------+-----------+----------+--------------+ CFV Full           Yes      Yes                                 +----+---------------+---------+-----------+----------+--------------+ SFJ Full                                                        +----+---------------+---------+-----------+----------+--------------+     Summary: RIGHT: - There is no evidence of deep vein thrombosis in the lower extremity.  - No cystic structure found in the popliteal fossa.  LEFT: - No evidence of common femoral vein obstruction.  *See table(s) above for measurements and observations.    Preliminary       Assessment/Plan  Elevated troponin -pt with hx of myocarditis concerning for the same given leukocytosis and likely viral pneumonia on CT. Also with suspected acute mild CHF. -obtain echo - Trial low dose IV 20mg  Lasix  - Strict intake and output - Daily weights -Continue Coreg, atorvastatin  Acute on chronic gout flare of left elbow  -start oral prednisone -Continue allopurinol  Bibasilar opacities concerning for viral pneumonia -COVID test negative but still suspicious.  We will continue contact and airborne precaution and consider repeat testing tomorrow. -Obtain RVP -Obtain procalcitonin  Hypokalemia - Replete with oral K  Type 2 diabetes - Placed on moderate sliding scale  Chronic lower leg pain - Continue home hydrocodone  Hx of remote DVT -on Xarelto but reports for certain it has been more than a year ago and was provoked since he laid in bed for a week due to a gout flare. No known hypercoagulable disorder. Will d/c Xarelto.  Morbid Obesity - Complicates clinical course  DVT prophylaxis:.Lovenox Code Status: Full Family  Communication: Plan discussed with patient at bedside  disposition Plan: Home with observation Consults called:  Admission status: Observation   Level of care: Telemetry  Status is: Observation  The patient remains OBS appropriate and will d/c before 2 midnights.  Dispo: The patient is from: Home              Anticipated d/c is to: Home              Patient currently is not medically stable to d/c.   Difficult to place patient No         DO [facility]   If 7PM-7AM, please contact night-coverage [email or web address]   10/01/2020, 9:12 PM

## 2020-10-01 NOTE — ED Notes (Signed)
Patient transported to CT 

## 2020-10-01 NOTE — ED Notes (Addendum)
No [place] rn available in ED at this time placed IV team order. MD Myrtis Ser at bedside made aware

## 2020-10-01 NOTE — ED Triage Notes (Addendum)
Patient reports that he recently moved from [place]. patient c/o bilateral foot//leg swelling. patient also reports hemoptysis that started today. Patient also c/o fatigue, gout in his left elbow.

## 2020-10-01 NOTE — ED Provider Notes (Signed)
Emergency Medicine Provider Triage Evaluation Note  Alekxander Isola , a 30 y.o. male  was evaluated in triage.  Pt complains of sob.  Review of Systems  Positive: Sob, hemoptysis, leg swelling, fatigue Negative: Fever, cp  Physical Exam  There were no vitals taken for this visit. Gen:   Awake, but drowsy Resp:  Tachypneic, decreased breath sounds MSK:   Moves extremities without difficulty  Other:  2+ edema to RLE most significant to dorsum of foot  Medical Decision Making  Medically screening exam initiated at 3:14 PM.  Appropriate orders placed.  Ajax Schroll was informed that the remainder of the evaluation will be completed by another provider, this initial triage assessment does not replace that evaluation, and the importance of remaining in the ED until their evaluation is complete.  Pt report R leg swelling for several days, now having SOB and hemoptysis x 2 days along with fatigue.  Recent travel from [place] over a week ago.    Fayrene Helper, PA-C 10/01/20 1516    Sabino Donovan, MD 10/01/20 [phone]

## 2020-10-02 ENCOUNTER — Observation Stay (HOSPITAL_BASED_OUTPATIENT_CLINIC_OR_DEPARTMENT_OTHER): Payer: No Typology Code available for payment source

## 2020-10-02 DIAGNOSIS — I340 Nonrheumatic mitral (valve) insufficiency: Secondary | ICD-10-CM | POA: Diagnosis not present

## 2020-10-02 DIAGNOSIS — I513 Intracardiac thrombosis, not elsewhere classified: Secondary | ICD-10-CM

## 2020-10-02 DIAGNOSIS — R778 Other specified abnormalities of plasma proteins: Secondary | ICD-10-CM

## 2020-10-02 DIAGNOSIS — I5043 Acute on chronic combined systolic (congestive) and diastolic (congestive) heart failure: Secondary | ICD-10-CM

## 2020-10-02 LAB — GLUCOSE, CAPILLARY
Glucose-Capillary: 281 mg/dL — ABNORMAL HIGH (ref 70–99)
Glucose-Capillary: 365 mg/dL — ABNORMAL HIGH (ref 70–99)
Glucose-Capillary: 266 mg/dL — ABNORMAL HIGH (ref 70–99)
Glucose-Capillary: 229 mg/dL — ABNORMAL HIGH (ref 70–99)

## 2020-10-02 LAB — RESPIRATORY PANEL BY PCR

## 2020-10-02 LAB — BASIC METABOLIC PANEL
Anion gap: 11 (ref 5–15)
BUN: 13 mg/dL (ref 6–20)
CO2: 26 mmol/L (ref 22–32)
Calcium: 9.1 mg/dL (ref 8.9–10.3)
Chloride: 105 mmol/L (ref 98–111)
Creatinine, Ser: 1.04 mg/dL (ref 0.61–1.24)
GFR, Estimated: >60 mL/min (ref >60)
Glucose, Bld: 275 mg/dL — ABNORMAL HIGH (ref 70–99)
Potassium: 4 mmol/L (ref 3.5–5.1)
Sodium: 142 mmol/L (ref 135–145)

## 2020-10-02 LAB — CBC
HCT: 42.2 % (ref 39.0–52.0)
Hemoglobin: 13.4 g/dL (ref 13.0–17.0)
MCH: 27 pg (ref 26.0–34.0)
MCHC: 31.8 g/dL (ref 30.0–36.0)
MCV: 85.1 fL (ref 80.0–100.0)
Platelets: 285 10*3/uL (ref 150–400)
RBC: 4.96 MIL/uL (ref 4.22–5.81)
RDW: 17.2 % — ABNORMAL HIGH (ref 11.5–15.5)
WBC: 9.4 10*3/uL (ref 4.0–10.5)
nRBC: 0 % (ref 0.0–0.2)

## 2020-10-02 LAB — ECHOCARDIOGRAM COMPLETE
Area-P 1/2: 7.44 cm2
Height: 69 in
S' Lateral: 6.1 cm
Weight: 5668.47 oz

## 2020-10-02 LAB — CBG MONITORING, ED: Glucose-Capillary: 278 mg/dL — ABNORMAL HIGH (ref 70–99)

## 2020-10-02 LAB — PROTIME-INR
INR: 1 (ref 0.8–1.2)
Prothrombin Time: 13.4 seconds (ref 11.4–15.2)

## 2020-10-02 LAB — HIV ANTIBODY (ROUTINE TESTING W REFLEX): HIV Screen 4th Generation wRfx: NONREACTIVE

## 2020-10-02 LAB — HEPARIN LEVEL (UNFRACTIONATED): Heparin Unfractionated: 0.44 IU/mL (ref 0.30–0.70)

## 2020-10-02 LAB — APTT
aPTT: 32 seconds (ref 24–36)
aPTT: 47 seconds — ABNORMAL HIGH (ref 24–36)

## 2020-10-02 LAB — PROCALCITONIN: Procalcitonin: <0.1 ng/mL

## 2020-10-02 MED ORDER — WARFARIN - PHARMACIST DOSING INPATIENT: Freq: Every day | Status: DC

## 2020-10-02 MED ORDER — INSULIN GLARGINE 100 UNIT/ML ~~LOC~~ SOLN
20.0000 [IU] | Freq: Every day | SUBCUTANEOUS | Status: DC
  Administered 2020-10-02 – 2020-10-04 (×3): 20 [IU] via SUBCUTANEOUS
  Filled 2020-10-02 (×3): qty 0.2

## 2020-10-02 MED ORDER — WARFARIN SODIUM 5 MG PO TABS
10.0000 mg | ORAL_TABLET | Freq: Once | ORAL | Status: AC
  Administered 2020-10-02: 10 mg via ORAL
  Filled 2020-10-02: qty 2

## 2020-10-02 MED ORDER — LEVOFLOXACIN 750 MG PO TABS
750.0000 mg | ORAL_TABLET | Freq: Every day | ORAL | Status: DC
  Administered 2020-10-02: 750 mg via ORAL
  Filled 2020-10-02: qty 1

## 2020-10-02 MED ORDER — PERFLUTREN LIPID MICROSPHERE
1.0000 mL | INTRAVENOUS | Status: AC | PRN
  Administered 2020-10-02: 2 mL via INTRAVENOUS
  Filled 2020-10-02: qty 10

## 2020-10-02 MED ORDER — HEPARIN BOLUS VIA INFUSION
2500.0000 [IU] | Freq: Once | INTRAVENOUS | Status: AC
  Administered 2020-10-02: 2500 [IU] via INTRAVENOUS
  Filled 2020-10-02: qty 2500

## 2020-10-02 MED ORDER — ENOXAPARIN SODIUM 80 MG/0.8ML IJ SOSY: 80.0000 mg | PREFILLED_SYRINGE | Freq: Every day | INTRAMUSCULAR | Status: DC

## 2020-10-02 MED ORDER — HEPARIN (PORCINE) 25000 UT/250ML-% IV SOLN
1650.0000 [IU]/h | INTRAVENOUS | Status: DC
  Administered 2020-10-03: 1650 [IU]/h via INTRAVENOUS
  Filled 2020-10-02: qty 250

## 2020-10-02 MED ORDER — SACUBITRIL-VALSARTAN 24-26 MG PO TABS
1.0000 | ORAL_TABLET | Freq: Two times a day (BID) | ORAL | Status: DC
  Administered 2020-10-02 – 2020-10-04 (×5): 1 via ORAL
  Filled 2020-10-02 (×5): qty 1

## 2020-10-02 MED ORDER — HEPARIN (PORCINE) 25000 UT/250ML-% IV SOLN
1450.0000 [IU]/h | INTRAVENOUS | Status: DC
  Administered 2020-10-02: 1450 [IU]/h via INTRAVENOUS
  Filled 2020-10-02 (×2): qty 250

## 2020-10-02 NOTE — Progress Notes (Signed)
  Echocardiogram 2D Echocardiogram with contrast has been performed.  Roosvelt Maser F 10/02/2020, 10:23 AM

## 2020-10-02 NOTE — ED Notes (Signed)
Pt moved to a more comfortable, inpatient hospital bed.

## 2020-10-02 NOTE — Progress Notes (Addendum)
PROGRESS NOTE    Marcus Atkins  ZOX:[id number] DOB: [birth year] DOA: 10/01/2020 PCP: Pcp, No   Chief Complain: Cough, shortness of breath  Brief Narrative: Patient is a 30 year old male with history of myocarditis, chronic systolic congestive heart failure, type 2 diabetes, COVID in 2020, gout, history of DVT on Xarelto, morbidly obese who presented here to the emergency room with complaints of cough, shortness of breath worsening with exertion, bilateral lower extremity edema.  He also reported left elbow pain from gout exacerbation.  On presentation he had mild grade temperature, tachycardic, tachypneic, not hypoxic.  Troponins were mildly elevated.  EKG showed left ventricular hypertrophy.  Venous Doppler was negative for DVT bilaterally.  CT angiogram showed bilateral groundglass opacity consistent with fluid overload versus atypical pneumonia.  COVID screen test, flu test negative.  Echo done here showed moderate size apical thrombus, left ventricle ejection fraction of 25 to 30%.  Cardiology consulted.  Assessment & Plan:   Principal Problem:   Elevated troponin Active Problems:   Gout attack   Viral pneumonia   Hypokalemia   DM (diabetes mellitus), type 2 (HCC)   Obesity, Class III, BMI 40-49.9 (morbid obesity) (HCC)   Apical thrombus: Echo showed 2.4 X1 centimeter thrombus.  Cardiology is following.  Currently on heparin and warfarin bridge.  Plan is to put him on warfarin on discharge.  He was previously taking anticoagulation but not now.  Acute on chronic combined systolic/diastolic congestive heart failure: History of myocarditis.  Echo done yesterday actually showed 25 to 30%, severely decreased left ventricular function, global hypokinesis, dilation of left ventricular internal cavity.  Started on Entresto, diuretics, carvedilol. Continue monitor daily weight, input/output.  He does not have signs of acute heart failure.  But complains of cough and dyspnea on exertion. He was  following with advanced heart failure at [place] [facility] [place] but recently moved to [facility].  Cardiac MRI on 3/70/20 showed severe left ventricular systolic dysfunction with ejection fraction of 14%, a small left ventricular thrombus.  He was discharged on LifeVest but he was not started on anticoagulation for his left ventricular thrombus.  Currently he is not following with cardiology  Abnormal chest imaging: CT angiogram showed bibasilar opacity concerning for atypical pneumonia or viral pneumonia.  Procalcitonin negative.  He has some productive cough, afebrile.  Chest findings are most likely secondary to fluid/congestive heart failure.  Antibiotics discontinued.  Respiratory viral panel negative.  Acute on chronic gout flare: Has erythema, tenderness of left elbow.  Takes allopurinol at home.  Started on oral prednisone  Hypokalemia: Supplemented with potassium  Diabetes type 2: On sliding's insulin and Lantus.  He is hyperglycemic likely secondary to stress.  Monitor blood sugars.  We are following up new hemoglobin A1c level.    History of remote DVT: Taking Xarelto at home.  Anticoagulation changed to warfarin.  Chronic bilateral lower extremity pain: Continue supportive care, pain management  Morbid obesity: BMI of 52.           DVT prophylaxis:Heparin/warfarin Code Status:Full  Family Communication: None at bedside Status is: Observation  The patient remains OBS appropriate and will d/c before 2 midnights.  Dispo: The patient is from: Home              Anticipated d/c is to: Home              Patient currently is not medically stable to d/c.   Difficult to place patient No  Consultants:   Procedures:  Antimicrobials:  Anti-infectives (From admission, onward)   Start     Dose/Rate Route Frequency Ordered Stop   10/02/20 1130  levofloxacin (LEVAQUIN) tablet 750 mg  Status:  Discontinued        750 mg Oral Daily 10/02/20 1036 10/02/20 1305       Subjective:  Patient seen and examined at bedside this morning.  Hemodynamically stable.  Feels better today.  Denies any worsening of cough or shortness of breath, still having some productive cough though.  Lower extremity edema has improved.  Complains of left elbow pain.   Objective: Vitals:   10/02/20 0408 10/02/20 0500 10/02/20 0814 10/02/20 1207  BP: 102/65  (!) 122/94 118/90  Pulse: (!) 103  (!) 108 (!) 105  Resp: 20  17 18   Temp: 97.8 F (36.6 C)  98.3 F (36.8 C) 98.3 F (36.8 C)  TempSrc:      SpO2: 94%  98% 98%  Weight:  (!) 160.7 kg    Height:        Intake/Output Summary (Last 24 hours) at 10/02/2020 1326 Last data filed at 10/02/2020 0900 Gross per 24 hour  Intake 240 ml  Output 4100 ml  Net -3860 ml   Filed Weights   10/01/20 1518 10/02/20 0020 10/02/20 0500  Weight: (!) 145.2 kg (!) 161.6 kg (!) 160.7 kg    Examination:  General exam: Appears calm and comfortable ,Not in distress, morbidly obese HEENT:PERRL,Oral mucosa moist, Ear/Nose normal on gross exam Respiratory system: Bilateral equal air entry, normal vesicular breath sounds, no wheezes or crackles  Cardiovascular system: S1 & S2 heard, RRR. No JVD, murmurs, rubs, gallops or clicks. No pedal edema. Gastrointestinal system: Abdomen is nondistended, soft and nontender. No organomegaly or masses felt. Normal bowel sounds heard. Central nervous system: Alert and oriented. No focal neurological deficits. Extremities: No edema, no clubbing ,no cyanosis Skin: No rashes, lesions or ulcers,no icterus ,no pallor      Data Reviewed: I have personally reviewed following labs and imaging studies  CBC: Recent Labs  Lab 10/01/20 1635 10/02/20 0607  WBC 11.3* 9.4  NEUTROABS 7.3  --   HGB 13.6 13.4  HCT 43.8 42.2  MCV 86.2 85.1  PLT 294 285   Basic Metabolic Panel: Recent Labs  Lab 10/01/20 1515 10/02/20 0607  NA 143 142  K 3.1* 4.0  CL 108 105  CO2 27 26  GLUCOSE 153* 275*  BUN 11 13   CREATININE 1.19 1.04  CALCIUM 9.1 9.1   GFR: Estimated Creatinine Clearance: 153.9 mL/min (by C-G formula based on SCr of 1.04 mg/dL). Liver Function Tests: No results for input(s): AST, ALT, ALKPHOS, BILITOT, PROT, ALBUMIN in the last 168 hours. No results for input(s): LIPASE, AMYLASE in the last 168 hours. No results for input(s): AMMONIA in the last 168 hours. Coagulation Profile: No results for input(s): INR, PROTIME in the last 168 hours. Cardiac Enzymes: No results for input(s): CKTOTAL, CKMB, CKMBINDEX, TROPONINI in the last 168 hours. BNP (last 3 results) No results for input(s): PROBNP in the last 8760 hours. HbA1C: No results for input(s): HGBA1C in the last 72 hours. CBG: Recent Labs  Lab 10/02/20 0136 10/02/20 0743 10/02/20 1209  GLUCAP 278* 281* 365*   Lipid Profile: No results for input(s): CHOL, HDL, LDLCALC, TRIG, CHOLHDL, LDLDIRECT in the last 72 hours. Thyroid Function Tests: No results for input(s): TSH, T4TOTAL, FREET4, T3FREE, THYROIDAB in the last 72 hours. Anemia Panel: No results for input(s):  VITAMINB12, FOLATE, FERRITIN, TIBC, IRON, RETICCTPCT in the last 72 hours. Sepsis Labs: Recent Labs  Lab 10/02/20 0121  PROCALCITON <0.10    Recent Results (from the past 240 hour(s))  Resp Panel by RT-PCR (Flu A&B, Covid) Nasopharyngeal Swab     Status: None   Collection Time: 10/01/20  4:35 PM   Specimen: Nasopharyngeal Swab; Nasopharyngeal(NP) swabs in vial transport medium  Result Value Ref Range Status   SARS Coronavirus 2 by RT PCR NEGATIVE NEGATIVE Final    Comment: (NOTE) SARS-CoV-2 target nucleic acids are NOT DETECTED.  The SARS-CoV-2 RNA is generally detectable in upper respiratory specimens during the acute phase of infection. The lowest concentration of SARS-CoV-2 viral copies this assay can detect is 138 copies/mL. A negative result does not preclude SARS-Cov-2 infection and should not be used as the sole basis for treatment or other  patient management decisions. A negative result may occur with  improper specimen collection/handling, submission of specimen other than nasopharyngeal swab, presence of viral mutation(s) within the areas targeted by this assay, and inadequate number of viral copies(<138 copies/mL). A negative result must be combined with clinical observations, patient history, and epidemiological information. The expected result is Negative.  Fact Sheet for Patients:  BloggerCourse.com  Fact Sheet for Healthcare Providers:  SeriousBroker.it  This test is no t yet approved or cleared by the [place] FDA and  has been authorized for detection and/or diagnosis of SARS-CoV-2 by FDA under an Emergency Use Authorization (EUA). This EUA will remain  in effect (meaning this test can be used) for the duration of the COVID-19 declaration under Section 564(b)(1) of the Act, 21 U.S.C.section 360bbb-3(b)(1), unless the authorization is terminated  or revoked sooner.       Influenza A by PCR NEGATIVE NEGATIVE Final   Influenza B by PCR NEGATIVE NEGATIVE Final    Comment: (NOTE) The Xpert Xpress SARS-CoV-2/FLU/RSV plus assay is intended as an aid in the diagnosis of influenza from Nasopharyngeal swab specimens and should not be used as a sole basis for treatment. Nasal washings and aspirates are unacceptable for Xpert Xpress SARS-CoV-2/FLU/RSV testing.  Fact Sheet for Patients: BloggerCourse.com  Fact Sheet for Healthcare Providers: SeriousBroker.it  This test is not yet approved or cleared by the [place] FDA and has been authorized for detection and/or diagnosis of SARS-CoV-2 by FDA under an Emergency Use Authorization (EUA). This EUA will remain in effect (meaning this test can be used) for the duration of the COVID-19 declaration under Section 564(b)(1) of the Act, 21 U.S.C. section  360bbb-3(b)(1), unless the authorization is terminated or revoked.  Performed at [facility], [address], [place], [place] [address]   Respiratory (~20 pathogens) panel by PCR     Status: None   Collection Time: 10/01/20  9:12 PM   Specimen: Nasopharyngeal Swab; Respiratory  Result Value Ref Range Status   Adenovirus NOT DETECTED NOT DETECTED Final   Coronavirus 229E NOT DETECTED NOT DETECTED Final    Comment: (NOTE) The Coronavirus on the Respiratory Panel, DOES NOT test for the novel  Coronavirus (2019 nCoV)    Coronavirus HKU1 NOT DETECTED NOT DETECTED Final   Coronavirus NL63 NOT DETECTED NOT DETECTED Final   Coronavirus OC43 NOT DETECTED NOT DETECTED Final   Metapneumovirus NOT DETECTED NOT DETECTED Final   Rhinovirus / Enterovirus NOT DETECTED NOT DETECTED Final   Influenza A NOT DETECTED NOT DETECTED Final   Influenza B NOT DETECTED NOT DETECTED Final   Parainfluenza Virus 1 NOT  DETECTED NOT DETECTED Final   Parainfluenza Virus 2 NOT DETECTED NOT DETECTED Final   Parainfluenza Virus 3 NOT DETECTED NOT DETECTED Final   Parainfluenza Virus 4 NOT DETECTED NOT DETECTED Final   Respiratory Syncytial Virus NOT DETECTED NOT DETECTED Final   Bordetella pertussis NOT DETECTED NOT DETECTED Final   Bordetella Parapertussis NOT DETECTED NOT DETECTED Final   Chlamydophila pneumoniae NOT DETECTED NOT DETECTED Final   Mycoplasma pneumoniae NOT DETECTED NOT DETECTED Final    Comment: Performed at [facility], [address], [place], [place] [address]         Radiology Studies: DG Chest 2 View  Result Date: 10/01/2020 CLINICAL DATA:  Fatigue. EXAM: CHEST - 2 VIEW COMPARISON:  None. FINDINGS: Decreased lung volumes are seen which may be secondary to suboptimal patient inspiration. Increased bronchovascular lung markings are seen within the bilateral lung bases. There is no evidence of a pleural effusion or pneumothorax. The cardiac silhouette is mildly  enlarged. The visualized skeletal structures are unremarkable. IMPRESSION: Low lung volumes with bibasilar bronchovascular crowding. Correlation with follow-up chest plain film with improved patient inspiration is recommended if clinical symptoms persist. Electronically Signed   By: Aram Candela M.D.   On: 10/01/2020 16:11   CT Angio Chest PE W and/or Wo Contrast  Result Date: 10/01/2020 CLINICAL DATA:  PE suspected, shortness of breath, hemoptysis EXAM: CT ANGIOGRAPHY CHEST WITH CONTRAST TECHNIQUE: Multidetector CT imaging of the chest was performed using the standard protocol during bolus administration of intravenous contrast. Multiplanar CT image reconstructions and MIPs were obtained to evaluate the vascular anatomy. CONTRAST:  28mL OMNIPAQUE IOHEXOL 350 MG/ML SOLN COMPARISON:  None. FINDINGS: Cardiovascular: Satisfactory opacification of the pulmonary arteries to the segmental level. No evidence of pulmonary embolism. Cardiomegaly. No pericardial effusion. Mediastinum/Nodes: No enlarged mediastinal, hilar, or axillary lymph nodes. Thyroid gland, trachea, and esophagus demonstrate no significant findings. Lungs/Pleura: Extensive bilateral heterogeneous and ground-glass airspace opacity throughout the lungs, with a bibasilar predominance. No pleural effusion or pneumothorax. Upper Abdomen: No acute abnormality. Musculoskeletal: No chest wall abnormality. No acute or significant osseous findings. Review of the MIP images confirms the above findings. IMPRESSION: 1. Negative examination for pulmonary embolism. 2. Extensive bilateral heterogeneous and ground-glass airspace opacity throughout the lungs, with a bibasilar predominance. Findings are consistent with multifocal infection, including COVID-19 if clinically suspected. 3. Cardiomegaly. Electronically Signed   By: Lauralyn Primes M.D.   On: 10/01/2020 19:54   ECHOCARDIOGRAM COMPLETE  Result Date: 10/02/2020    ECHOCARDIOGRAM REPORT   Patient Name:    Marcus Atkins Date of Exam: 10/02/2020 Medical Rec #:  [id number]   Height:       69.0 in Accession #:    [id number]  Weight:       354.3 lb Date of Birth:  December 21, 1990   BSA:          2.634 m Patient Age:    30 years    BP:           122/95 mmHg Patient Gender: M           HR:           106 bpm. Exam Location:  Inpatient Procedure: 2D Echo, Cardiac Doppler, Color Doppler and Intracardiac            Opacification Agent Indications:    Elevated troponin  History:        Patient has no prior history of Echocardiogram examinations.  Risk Factors:Morbid obesity. Coughing. Lower extremity edema.                 [place]. Was previsouly                 seen by cardiology in [place]. H/O DVT.  Sonographer:    Roosvelt Maser RDCS Referring Phys: [id number] CHING T TU  Sonographer Comments: Patient is morbidly obese. IMPRESSIONS  1. There is a protruding, but relatively fixed moderate size apical thrombus (2.4 x 1 cm). Left ventricular ejection fraction, by estimation, is 25 to 30%. The left ventricle has severely decreased function. The left ventricle demonstrates global hypokinesis. The left ventricular internal cavity size was severely dilated. Left ventricular diastolic parameters are consistent with Grade III diastolic dysfunction (restrictive). Elevated left atrial pressure.  2. Right ventricular systolic function is normal. The right ventricular size is normal. Tricuspid regurgitation signal is inadequate for assessing PA pressure.  3. Left atrial size was moderately dilated.  4. The mitral valve is normal in structure. Mild to moderate mitral valve regurgitation.  5. The aortic valve is tricuspid. Aortic valve regurgitation is not visualized. No aortic stenosis is present.  6. The inferior vena cava is normal in size with greater than 50% respiratory variability, suggesting right atrial pressure of 3 mmHg. Comparison(s): No prior Echocardiogram. FINDINGS  Left Ventricle: There  is a protruding, but relatively fixed moderate size apical thrombus (2.4 x 1 cm). Left ventricular ejection fraction, by estimation, is 25 to 30%. The left ventricle has severely decreased function. The left ventricle demonstrates global hypokinesis. Definity contrast agent was given IV to delineate the left ventricular endocardial borders. The left ventricular internal cavity size was severely dilated. There is no left ventricular hypertrophy. Left ventricular diastolic parameters are consistent with Grade III diastolic dysfunction (restrictive). Elevated left atrial pressure. Right Ventricle: The right ventricular size is normal. No increase in right ventricular wall thickness. Right ventricular systolic function is normal. Tricuspid regurgitation signal is inadequate for assessing PA pressure. Left Atrium: Left atrial size was moderately dilated. Right Atrium: Right atrial size was normal in size. Pericardium: There is no evidence of pericardial effusion. Mitral Valve: The mitral valve is normal in structure. Mild to moderate mitral valve regurgitation, with centrally-directed jet. Tricuspid Valve: The tricuspid valve is normal in structure. Tricuspid valve regurgitation is not demonstrated. Aortic Valve: The aortic valve is tricuspid. Aortic valve regurgitation is not visualized. No aortic stenosis is present. Pulmonic Valve: The pulmonic valve was grossly normal. Pulmonic valve regurgitation is not visualized. Aorta: The aortic root is normal in size and structure. Venous: The inferior vena cava is normal in size with greater than 50% respiratory variability, suggesting right atrial pressure of 3 mmHg. IAS/Shunts: No atrial level shunt detected by color flow Doppler.  LEFT VENTRICLE PLAX 2D LVIDd:         7.00 cm LVIDs:         6.10 cm LV PW:         1.00 cm LV IVS:        1.00 cm LVOT diam:     2.20 cm LV SV:         29 LV SV Index:   11 LVOT Area:     3.80 cm  RIGHT VENTRICLE             IVC RV Basal diam:   5.60 cm     IVC diam: 2.00 cm RV Mid diam:  3.70 cm RV S prime:     13.50 cm/s TAPSE (M-mode): 2.7 cm LEFT ATRIUM             Index       RIGHT ATRIUM           Index LA diam:        4.80 cm 1.82 cm/m  RA Area:     25.60 cm LA Vol (A2C):   84.9 ml 32.23 ml/m RA Volume:   85.30 ml  32.38 ml/m LA Vol (A4C):   79.5 ml 30.18 ml/m LA Biplane Vol: 88.4 ml 33.56 ml/m  AORTIC VALVE LVOT Vmax:   47.70 cm/s LVOT Vmean:  33.800 cm/s LVOT VTI:    0.076 m  AORTA Ao Root diam: 2.90 cm MITRAL VALVE MV Area (PHT): 7.44 cm    SHUNTS MV Decel Time: 102 msec    Systemic VTI:  0.08 m MV E velocity: 89.50 cm/s  Systemic Diam: 2.20 cm MV A velocity: 60.80 cm/s MV E/A ratio:  1.47 Mihai Croitoru MD Electronically signed by Thurmon Fair MD Signature Date/Time: 10/02/2020/11:45:14 AM    Final    VAS [place] LOWER EXTREMITY VENOUS (DVT) (ONLY [facility] & [facility] 7a-7p)  Result Date: 10/02/2020  Lower Venous DVT Study Patient Name:  Marcus Atkins  Date of Exam:   10/01/2020 Medical Rec #: [id number]    Accession #:    [id number] Date of Birth: Oct 16, 1990    Patient Gender: M Patient Age:   30Y Exam Location:  [facility] Procedure:      VAS [place] LOWER EXTREMITY VENOUS (DVT) Referring Phys: [id number] BOWIE TRAN --------------------------------------------------------------------------------  Indications: Swelling, and SOB.  Comparison Study: No prior. Performing Technologist: Marilynne Halsted RDMS, RVT  Examination Guidelines: A complete evaluation includes B-mode imaging, spectral Doppler, color Doppler, and power Doppler as needed of all accessible portions of each vessel. Bilateral testing is considered an integral part of a complete examination. Limited examinations for reoccurring indications may be performed as noted. The reflux portion of the exam is performed with the patient in reverse Trendelenburg.  +---------+---------------+---------+-----------+----------+--------------+ RIGHT    CompressibilityPhasicitySpontaneityPropertiesThrombus  Aging +---------+---------------+---------+-----------+----------+--------------+ CFV      Full           Yes      Yes                                 +---------+---------------+---------+-----------+----------+--------------+ SFJ      Full                                                        +---------+---------------+---------+-----------+----------+--------------+ FV Prox  Full                                                        +---------+---------------+---------+-----------+----------+--------------+ FV Mid   Full                                                        +---------+---------------+---------+-----------+----------+--------------+  FV DistalFull                                                        +---------+---------------+---------+-----------+----------+--------------+ PFV      Full                                                        +---------+---------------+---------+-----------+----------+--------------+ POP      Full           Yes      Yes                                 +---------+---------------+---------+-----------+----------+--------------+ PTV      Full                                                        +---------+---------------+---------+-----------+----------+--------------+ PERO     Full                                                        +---------+---------------+---------+-----------+----------+--------------+   +----+---------------+---------+-----------+----------+--------------+ LEFTCompressibilityPhasicitySpontaneityPropertiesThrombus Aging +----+---------------+---------+-----------+----------+--------------+ CFV Full           Yes      Yes                                 +----+---------------+---------+-----------+----------+--------------+ SFJ Full                                                        +----+---------------+---------+-----------+----------+--------------+      Summary: RIGHT: - There is no evidence of deep vein thrombosis in the lower extremity.  - No cystic structure found in the popliteal fossa.  LEFT: - No evidence of common femoral vein obstruction.  *See table(s) above for measurements and observations. Electronically signed by Fabienne Bruns MD on 10/02/2020 at 12:01:08 PM.    Final         Scheduled Meds: . allopurinol  300 mg Oral BID  . atorvastatin  80 mg Oral Daily  . carvedilol  25 mg Oral BID  . furosemide  20 mg Intravenous Daily  . heparin  2,500 Units Intravenous Once  . insulin aspart  0-15 Units Subcutaneous TID PC,HS,0200  . insulin glargine  20 Units Subcutaneous Daily  . predniSONE  40 mg Oral Q breakfast  . sacubitril-valsartan  1 tablet Oral BID  . warfarin  10 mg Oral ONCE-1600  . Warfarin - Pharmacist Dosing Inpatient   Does not apply q1600   Continuous Infusions: . heparin  LOS: 0 days    Time spent:35 mins. More than 50% of that time was spent in counseling and/or coordination of care.      Burnadette Pop, MD [facility] P6/07/2020, 1:26 PM

## 2020-10-02 NOTE — Plan of Care (Signed)

## 2020-10-02 NOTE — Consult Note (Signed)
Cardiology Consultation:   Patient ID: Marcus Atkins MRN: [id number]; DOB: [birth year]  Admit date: 10/01/2020 Date of Consult: 10/02/2020  PCP:  Oneita Hurt, No   [facility] Providers Cardiologist:  None        Patient Profile:   Marcus Atkins is a 30 y.o. 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 Renford Dills.  History of Present Illness:   Mr. Marcus Atkins is a 30 year old male with a history of chronic systolic and diastolic heart failure.  He previously followed with advanced heart failure at the [facility] [place] and recently moved to [facility].  In March 2020 he presented to hospital at [facility] [place] with chest pain.  Was found to be hypertensive and tachycardic with troponins up to 2226.  CTA was negative for aortic dissection.  EKG showed LVH.  Echocardiogram on 07/12/2018 showed LVEF 10 to 15% with moderately reduced RV function, no significant valvular disease.  Coronary CTA was done on 07/17/2018 which showed no evidence of CAD.  Cardiac MRI on 07/16/2018 showed severe LV systolic dysfunction (EF 14%), small LV thrombus, mildly reduced RV function (EF 41%), RV insertion site LGE, elevated T1/T2/ECV suggesting myocarditis.  He was discharged with LifeVest and set up to follow with advanced heart failure at [facility] [place].  From review of records it does not appear that he was started on anticoagulation for his LV thrombus, reasons unclear.  Repeat echocardiogram 08/2018 showed LVEF 15 to 20%.  He stopped wearing his LifeVest and declined ICD.  He was last seen by heart failure at [place] in 10/2018.  Reports he then moved to [place] and did not establish with a cardiologist.  While in [place] in 2021, he developed a DVT during a period of immobility from a gout flare.  He was started on Eliquis.  Reports he had been taking that until he  moved back to [place] and establish with a new PCP who about 2 weeks ago switched him to Xarelto.  He reports he has not been compliant with his medicines, only taking about 3 times per week recently.  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  He reports he started having lower extremity edema few days ago.  Also began having cough and shortness of breath.  He denies any chest pain.  Given the symptoms, he presented to the ED yesterday.  Initial vital signs notable for BP 128/114, pulse 122, SPO2 94% on room air.  Labs showed creatinine 1.2, sodium 143, potassium 3.1, BNP 127, procalcitonin undetectable, troponin 129 > 125, D-dimer 0.64, COVID-19 negative, lower extremity duplex negative, CTPA showed no evidence of PE but extensive airspace opacities,  Echocardiogram today shows LVEF 25 to 30%, grade 3 diastolic dysfunction, normal RV function, mild to moderate MR, LV apical thrombus.  He has been diuresed with IV Lasix, reports feeling significantly improved.  Currently denies any shortness of breath.    Past Medical History:  Diagnosis Date  . Gout   . Myocarditis (HCC)     History reviewed. No pertinent surgical history.    Inpatient Medications: Scheduled Meds: . allopurinol  300 mg Oral BID  . atorvastatin  80 mg Oral Daily  . carvedilol  25 mg Oral BID  . enoxaparin (LOVENOX) injection  80 mg Subcutaneous QHS  . furosemide  20 mg Intravenous Daily  .  insulin aspart  0-15 Units Subcutaneous TID PC,HS,0200  . levofloxacin  750 mg Oral Daily  . predniSONE  40 mg Oral Q breakfast   Continuous Infusions:  PRN Meds: HYDROcodone-acetaminophen, pantoprazole, perflutren lipid microspheres (DEFINITY) IV suspension  Allergies:    Allergies  Allergen Reactions  . Phenergan [Promethazine]   . Tramadol Itching    Social History:   Social History   Socioeconomic History  . Marital status: Single    Spouse name: Not on file  . Number of children: Not on file  . Years  of education: Not on file  . Highest education level: Not on file  Occupational History  . Not on file  Tobacco Use  . Smoking status: Never Smoker  . Smokeless tobacco: Never Used  Vaping Use  . Vaping Use: Never used  Substance and Sexual Activity  . Alcohol use: Never  . Drug use: Never  . Sexual activity: Not on file  Other Topics Concern  . Not on file  Social History Narrative  . Not on file   Social Determinants of Health   Financial Resource Strain: Not on file  Food Insecurity: Not on file  Transportation Needs: Not on file  Physical Activity: Not on file  Stress: Not on file  Social Connections: Not on file  Intimate Partner Violence: Not on file    Family History:    Family History  Family history unknown: Yes     ROS:  Please see the history of present illness.   All other ROS reviewed and negative.     Physical Exam/Data:   Vitals:   10/02/20 0331 10/02/20 0408 10/02/20 0500 10/02/20 0814  BP: (!) 100/54 102/65  (!) 122/94  Pulse: (!) 107 (!) 103  (!) 108  Resp: (!) 24 20  17   Temp:  97.8 F (36.6 C)  98.3 F (36.8 C)  TempSrc:      SpO2: 94% 94%  98%  Weight:   (!) 160.7 kg   Height:        Intake/Output Summary (Last 24 hours) at 10/02/2020 1158 Last data filed at 10/02/2020 0900 Gross per 24 hour  Intake 240 ml  Output 4100 ml  Net -3860 ml   Last 3 Weights 10/02/2020 10/02/2020 10/01/2020  Weight (lbs) 354 lb 4.5 oz 356 lb 4.2 oz 320 lb  Weight (kg) 160.7 kg 161.6 kg 145.151 kg     Body mass index is 52.32 kg/m.  General:   in no acute distress HEENT: normal Neck: no JVD appreciated Cardiac:  normal S1, S2; RRR; no murmur  Lungs:  clear to auscultation bilaterally, no wheezing, rhonchi or rales  Abd: soft, nontender Ext: no edema Musculoskeletal:  No deformities, BUE and BLE strength normal and equal Skin: warm and dry  Neuro:  no focal abnormalities noted Psych:  Normal affect   EKG:  The EKG was personally reviewed and  demonstrates: Sinus tachycardia, rate 122, LVH with reports patient abnormalities Telemetry:  Telemetry was personally reviewed and demonstrates: Normal sinus rhythm with rate 90s to 110s, bradycardia to 50s this morning, appeared Wenckebach  Relevant CV Studies:   Laboratory Data:  High Sensitivity Troponin:   Recent Labs  Lab 10/01/20 1635 10/01/20 1841  TROPONINIHS 129* 125*     Chemistry Recent Labs  Lab 10/01/20 1515 10/02/20 0607  NA 143 142  K 3.1* 4.0  CL 108 105  CO2 27 26  GLUCOSE 153* 275*  BUN 11 13  CREATININE 1.19 1.04  CALCIUM 9.1 9.1  GFRNONAA >60 >60  ANIONGAP 8 11    No results for input(s): PROT, ALBUMIN, AST, ALT, ALKPHOS, BILITOT in the last 168 hours. Hematology Recent Labs  Lab 10/01/20 1635 10/02/20 0607  WBC 11.3* 9.4  RBC 5.08 4.96  HGB 13.6 13.4  HCT 43.8 42.2  MCV 86.2 85.1  MCH 26.8 27.0  MCHC 31.1 31.8  RDW 17.4* 17.2*  PLT 294 285   BNP Recent Labs  Lab 10/01/20 1515  BNP 127.4*    DDimer  Recent Labs  Lab 10/01/20 1515  DDIMER 0.64*     Radiology/Studies:  DG Chest 2 View  Result Date: 10/01/2020 CLINICAL DATA:  Fatigue. EXAM: CHEST - 2 VIEW COMPARISON:  None. FINDINGS: Decreased lung volumes are seen which may be secondary to suboptimal patient inspiration. Increased bronchovascular lung markings are seen within the bilateral lung bases. There is no evidence of a pleural effusion or pneumothorax. The cardiac silhouette is mildly enlarged. The visualized skeletal structures are unremarkable. IMPRESSION: Low lung volumes with bibasilar bronchovascular crowding. Correlation with follow-up chest plain film with improved patient inspiration is recommended if clinical symptoms persist. Electronically Signed   By: Aram Candela M.D.   On: 10/01/2020 16:11   CT Angio Chest PE W and/or Wo Contrast  Result Date: 10/01/2020 CLINICAL DATA:  PE suspected, shortness of breath, hemoptysis EXAM: CT ANGIOGRAPHY CHEST WITH CONTRAST  TECHNIQUE: Multidetector CT imaging of the chest was performed using the standard protocol during bolus administration of intravenous contrast. Multiplanar CT image reconstructions and MIPs were obtained to evaluate the vascular anatomy. CONTRAST:  59mL OMNIPAQUE IOHEXOL 350 MG/ML SOLN COMPARISON:  None. FINDINGS: Cardiovascular: Satisfactory opacification of the pulmonary arteries to the segmental level. No evidence of pulmonary embolism. Cardiomegaly. No pericardial effusion. Mediastinum/Nodes: No enlarged mediastinal, hilar, or axillary lymph nodes. Thyroid gland, trachea, and esophagus demonstrate no significant findings. Lungs/Pleura: Extensive bilateral heterogeneous and ground-glass airspace opacity throughout the lungs, with a bibasilar predominance. No pleural effusion or pneumothorax. Upper Abdomen: No acute abnormality. Musculoskeletal: No chest wall abnormality. No acute or significant osseous findings. Review of the MIP images confirms the above findings. IMPRESSION: 1. Negative examination for pulmonary embolism. 2. Extensive bilateral heterogeneous and ground-glass airspace opacity throughout the lungs, with a bibasilar predominance. Findings are consistent with multifocal infection, including COVID-19 if clinically suspected. 3. Cardiomegaly. Electronically Signed   By: Lauralyn Primes M.D.   On: 10/01/2020 19:54   ECHOCARDIOGRAM COMPLETE  Result Date: 10/02/2020    ECHOCARDIOGRAM REPORT   Patient Name:   Marcus Atkins Date of Exam: 10/02/2020 Medical Rec #:  [id number]   Height:       69.0 in Accession #:    [id number]  Weight:       354.3 lb Date of Birth:  [birth year]   BSA:          2.634 m Patient Age:    30 years    BP:           122/95 mmHg Patient Gender: M           HR:           106 bpm. Exam Location:  Inpatient Procedure: 2D Echo, Cardiac Doppler, Color Doppler and Intracardiac            Opacification Agent Indications:    Elevated troponin  History:        Patient has no prior history of  Echocardiogram examinations.  Risk Factors:Morbid obesity. Coughing. Lower extremity edema.                 [place]. Was previsouly                 seen by cardiology in [place]. H/O DVT.  Sonographer:    Roosvelt Maser RDCS Referring Phys: [id number] CHING T TU  Sonographer Comments: Patient is morbidly obese. IMPRESSIONS  1. There is a protruding, but relatively fixed moderate size apical thrombus (2.4 x 1 cm). Left ventricular ejection fraction, by estimation, is 25 to 30%. The left ventricle has severely decreased function. The left ventricle demonstrates global hypokinesis. The left ventricular internal cavity size was severely dilated. Left ventricular diastolic parameters are consistent with Grade III diastolic dysfunction (restrictive). Elevated left atrial pressure.  2. Right ventricular systolic function is normal. The right ventricular size is normal. Tricuspid regurgitation signal is inadequate for assessing PA pressure.  3. Left atrial size was moderately dilated.  4. The mitral valve is normal in structure. Mild to moderate mitral valve regurgitation.  5. The aortic valve is tricuspid. Aortic valve regurgitation is not visualized. No aortic stenosis is present.  6. The inferior vena cava is normal in size with greater than 50% respiratory variability, suggesting right atrial pressure of 3 mmHg. Comparison(s): No prior Echocardiogram. FINDINGS  Left Ventricle: There is a protruding, but relatively fixed moderate size apical thrombus (2.4 x 1 cm). Left ventricular ejection fraction, by estimation, is 25 to 30%. The left ventricle has severely decreased function. The left ventricle demonstrates global hypokinesis. Definity contrast agent was given IV to delineate the left ventricular endocardial borders. The left ventricular internal cavity size was severely dilated. There is no left ventricular hypertrophy. Left ventricular diastolic parameters are consistent  with Grade III diastolic dysfunction (restrictive). Elevated left atrial pressure. Right Ventricle: The right ventricular size is normal. No increase in right ventricular wall thickness. Right ventricular systolic function is normal. Tricuspid regurgitation signal is inadequate for assessing PA pressure. Left Atrium: Left atrial size was moderately dilated. Right Atrium: Right atrial size was normal in size. Pericardium: There is no evidence of pericardial effusion. Mitral Valve: The mitral valve is normal in structure. Mild to moderate mitral valve regurgitation, with centrally-directed jet. Tricuspid Valve: The tricuspid valve is normal in structure. Tricuspid valve regurgitation is not demonstrated. Aortic Valve: The aortic valve is tricuspid. Aortic valve regurgitation is not visualized. No aortic stenosis is present. Pulmonic Valve: The pulmonic valve was grossly normal. Pulmonic valve regurgitation is not visualized. Aorta: The aortic root is normal in size and structure. Venous: The inferior vena cava is normal in size with greater than 50% respiratory variability, suggesting right atrial pressure of 3 mmHg. IAS/Shunts: No atrial level shunt detected by color flow Doppler.  LEFT VENTRICLE PLAX 2D LVIDd:         7.00 cm LVIDs:         6.10 cm LV PW:         1.00 cm LV IVS:        1.00 cm LVOT diam:     2.20 cm LV SV:         29 LV SV Index:   11 LVOT Area:     3.80 cm  RIGHT VENTRICLE             IVC RV Basal diam:  5.60 cm     IVC diam: 2.00 cm RV Mid diam:  3.70 cm RV S prime:     13.50 cm/s TAPSE (M-mode): 2.7 cm LEFT ATRIUM             Index       RIGHT ATRIUM           Index LA diam:        4.80 cm 1.82 cm/m  RA Area:     25.60 cm LA Vol (A2C):   84.9 ml 32.23 ml/m RA Volume:   85.30 ml  32.38 ml/m LA Vol (A4C):   79.5 ml 30.18 ml/m LA Biplane Vol: 88.4 ml 33.56 ml/m  AORTIC VALVE LVOT Vmax:   47.70 cm/s LVOT Vmean:  33.800 cm/s LVOT VTI:    0.076 m  AORTA Ao Root diam: 2.90 cm MITRAL VALVE MV  Area (PHT): 7.44 cm    SHUNTS MV Decel Time: 102 msec    Systemic VTI:  0.08 m MV E velocity: 89.50 cm/s  Systemic Diam: 2.20 cm MV A velocity: 60.80 cm/s MV E/A ratio:  1.47 Mihai Croitoru MD Electronically signed by Thurmon Fair MD Signature Date/Time: 10/02/2020/11:45:14 AM    Final    VAS [place] LOWER EXTREMITY VENOUS (DVT) (ONLY [facility] & [facility] 7a-7p)  Result Date: 10/01/2020  Lower Venous DVT Study Patient Name:  WYATTE DAMES  Date of Exam:   10/01/2020 Medical Rec #: [id number]    Accession #:    [id number] Date of Birth: 22-Feb-1991    Patient Gender: M Patient Age:   30Y Exam Location:  [facility] Procedure:      VAS [place] LOWER EXTREMITY VENOUS (DVT) Referring Phys: [id number] BOWIE TRAN --------------------------------------------------------------------------------  Indications: Swelling, and SOB.  Comparison Study: No prior. Performing Technologist: Marilynne Halsted RDMS, RVT  Examination Guidelines: A complete evaluation includes B-mode imaging, spectral Doppler, color Doppler, and power Doppler as needed of all accessible portions of each vessel. Bilateral testing is considered an integral part of a complete examination. Limited examinations for reoccurring indications may be performed as noted. The reflux portion of the exam is performed with the patient in reverse Trendelenburg.  +---------+---------------+---------+-----------+----------+--------------+ RIGHT    CompressibilityPhasicitySpontaneityPropertiesThrombus Aging +---------+---------------+---------+-----------+----------+--------------+ CFV      Full           Yes      Yes                                 +---------+---------------+---------+-----------+----------+--------------+ SFJ      Full                                                        +---------+---------------+---------+-----------+----------+--------------+ FV Prox  Full                                                         +---------+---------------+---------+-----------+----------+--------------+ FV Mid   Full                                                        +---------+---------------+---------+-----------+----------+--------------+  FV DistalFull                                                        +---------+---------------+---------+-----------+----------+--------------+ PFV      Full                                                        +---------+---------------+---------+-----------+----------+--------------+ POP      Full           Yes      Yes                                 +---------+---------------+---------+-----------+----------+--------------+ PTV      Full                                                        +---------+---------------+---------+-----------+----------+--------------+ PERO     Full                                                        +---------+---------------+---------+-----------+----------+--------------+   +----+---------------+---------+-----------+----------+--------------+ LEFTCompressibilityPhasicitySpontaneityPropertiesThrombus Aging +----+---------------+---------+-----------+----------+--------------+ CFV Full           Yes      Yes                                 +----+---------------+---------+-----------+----------+--------------+ SFJ Full                                                        +----+---------------+---------+-----------+----------+--------------+     Summary: RIGHT: - There is no evidence of deep vein thrombosis in the lower extremity.  - No cystic structure found in the popliteal fossa.  LEFT: - No evidence of common femoral vein obstruction.  *See table(s) above for measurements and observations.    Preliminary      Assessment and Plan:   Acute on chronic combined systolic and diastolic heart failure: Diagnosed with severe LV systolic dysfunction in March 2020 at [facility] [place], LVEF  10 to 15% at that time.  Coronary CTA showed no CAD.  Cardiac MRI Cardiac MRI on 07/16/2018 showed severe LV systolic dysfunction (EF 14%), small LV thrombus, mildly reduced RV function (EF 41%), RV insertion site LGE, elevated T1/T2/ECV suggesting myocarditis.  He was discharged with LifeVest but stopped wearing and declined ICD.  [place], last seen 10/2018.  Has not seen a cardiologist since that time.  [place]oved to [place] 1 week ago.  Reported onset of cough/shortness of breath and lower extremity edema a  few days ago.  Echocardiogram today shows LVEF 25 to 30%, grade 3 diastolic dysfunction, normal RV function, mild to moderate MR, LV apical thrombus. - Reports symptoms have resolved with diuresis and appears euvolemic, though difficult to assess given body habitus.  Can likely can switch to p.o. Lasix tomorrow -Continue carvedilol 25 mg twice daily -Will add Entresto 24-26 mg twice daily -Will plan follow-up with advanced heart failure on discharge  LV apical thrombus: Initially seen on cardiac MRI 06/2018, but appears was not started on anticoagulation at that time.  While in [place] in 2021, he developed a DVT during a period of immobility from a gout flare.  He was started on Eliquis.  Reports he had been taking that until he moved back to [place] and established with a new PCP who about 2 weeks ago switched him to Xarelto -Given persistent LV thrombus despite taking Eliquis last year and recently switched to Xarelto (though questionable compliance), and given there is some evidence that warfarin is associated with better outcomes for LV thrombus than DOACs, would favor starting warfarin.  Did discuss with patient that this will require frequent INR checks at first and he is agreeable to doing this.  Would start heparin gtt and start warfarin per pharmacy    For questions or updates, please contact [facility] Please consult [email or web address] for  contact info under    Signed, Little Ishikawa, MD  10/02/2020 11:58 AM

## 2020-10-02 NOTE — Progress Notes (Signed)
ANTICOAGULATION CONSULT NOTE - Initial Consult  Pharmacy Consult for Warfarin with Heparin bridge Indication: Persistent LV thrombus  Allergies  Allergen Reactions  . Phenergan [Promethazine]   . Tramadol Itching    Patient Measurements: Height: 5\' 9"  (175.3 cm) Weight: (!) 160.7 kg (354 lb 4.5 oz) IBW/kg (Calculated) : 70.7 Heparin Dosing Weight: 105  Vital Signs: Temp: 98.3 F (36.8 C) (06/04 1207) BP: 118/90 (06/04 1207) Pulse Rate: 105 (06/04 1207)  Labs: Recent Labs    10/01/20 1515 10/01/20 1635 10/01/20 1841 10/02/20 0607  HGB  --  13.6  --  13.4  HCT  --  43.8  --  42.2  PLT  --  294  --  285  CREATININE 1.19  --   --  1.04  TROPONINIHS  --  129* 125*  --     Estimated Creatinine Clearance: 153.9 mL/min (by C-G formula based on SCr of 1.04 mg/dL).   Medical History: Past Medical History:  Diagnosis Date  . Gout   . Myocarditis (HCC)     Medications:  Xarelto 20 mg daily PTA  Assessment: 30 y/o M with a h/o chronic systolic and diastolic heart failure, LV thrombus (not initially started on anticoagulation in 2020), DVT (2021) started on Eliquis. Cardiology wants to convert patient to warfarin due to persistent LV thrombus despite Eliquis > Xarelto w/ evidence pointing towards better outcomes with warfarin than DOACs (though questionable compliance). Pharmacy consulted to initiate wararin dosing with IV UFH bridging. Last dose of VTE ppx Lovenox was last PM/early AM. Of note, patient is on allopurinol which may potentiate the effects of warfarin.   Goal of Therapy:  INR 2-3 Heparin level 0.3-0.7 units/ml aPTT 66-102 seconds Monitor platelets by anticoagulation protocol: Yes   Plan:  Baseline aPTT, PT/INR, and HL Give 2500 units bolus x 1 (half bolus with recent Lovenox administration) Start heparin infusion at 1450 units/hr Check anti-Xa level or aPTT (based on b/l labs) in 6 hours and daily while on heparin Warfarin 10 mg once  Daily PT/INR,  CBC   Christerpher Clos D 10/02/2020,1:27 PM

## 2020-10-02 NOTE — Progress Notes (Signed)
ANTICOAGULATION CONSULT NOTE - Follow Up Consult  Pharmacy Consult for Warfarin with Heparin bridge Indication: Persistent LV thrombus  Allergies  Allergen Reactions  . Phenergan [Promethazine]   . Tramadol Itching    Patient Measurements: Height: 5\' 9"  (175.3 cm) Weight: (!) 160.7 kg (354 lb 4.5 oz) IBW/kg (Calculated) : 70.7 Heparin Dosing Weight: 105  Vital Signs: Temp: 98.5 F (36.9 C) (06/04 2105) Temp Source: Oral (06/04 2105) BP: 129/87 (06/04 2105) Pulse Rate: 110 (06/04 2105)  Labs: Recent Labs    10/01/20 1515 10/01/20 1635 10/01/20 1841 10/02/20 0607 10/02/20 1350 10/02/20 2032  HGB  --  13.6  --  13.4  --   --   HCT  --  43.8  --  42.2  --   --   PLT  --  294  --  285  --   --   APTT  --   --   --   --  32 47*  LABPROT  --   --   --   --  13.4  --   INR  --   --   --   --  1.0  --   HEPARINUNFRC  --   --   --   --  0.44  --   CREATININE 1.19  --   --  1.04  --   --   TROPONINIHS  --  129* 125*  --   --   --     Estimated Creatinine Clearance: 153.9 mL/min (by C-G formula based on SCr of 1.04 mg/dL).   Medical History: Past Medical History:  Diagnosis Date  . Gout   . Myocarditis (HCC)     Medications:  Xarelto 20 mg daily PTA  Assessment: 30 y/o M with a h/o chronic systolic and diastolic heart failure, LV thrombus (not initially started on anticoagulation in 2020), DVT (2021) started on Eliquis. Cardiology wants to convert patient to warfarin due to persistent LV thrombus despite Eliquis > Xarelto w/ evidence pointing towards better outcomes with warfarin than DOACs (though questionable compliance). Pharmacy consulted to initiate wararin dosing with IV UFH bridging. Last dose of VTE ppx Lovenox was last PM/early AM. Of note, patient is on allopurinol which may potentiate the effects of warfarin.   10/02/2020.  PTT subtherapeutic (47) on heparin 1450 units/hr  No bleeding or infusion interruptions per RN  Goal of Therapy:  INR 2-3 Heparin  level 0.3-0.7 units/ml aPTT 66-102 seconds Monitor platelets by anticoagulation protocol: Yes   Plan:  Repeat heparin 2500 units bolus x 1  Increase heparin infusion to 1750 units/hr Check anti-Xa leve and aPTT in 6 hours and daily while on heparin Daily PT/INR, CBC  12/02/2020, PharmD, BCPS Pharmacy: [phone] 10/02/2020,9:23 PM

## 2020-10-03 DIAGNOSIS — R001 Bradycardia, unspecified: Secondary | ICD-10-CM | POA: Diagnosis not present

## 2020-10-03 DIAGNOSIS — I110 Hypertensive heart disease with heart failure: Secondary | ICD-10-CM | POA: Diagnosis present

## 2020-10-03 DIAGNOSIS — Z888 Allergy status to other drugs, medicaments and biological substances status: Secondary | ICD-10-CM | POA: Diagnosis not present

## 2020-10-03 DIAGNOSIS — Z8616 Personal history of COVID-19: Secondary | ICD-10-CM | POA: Diagnosis not present

## 2020-10-03 DIAGNOSIS — I498 Other specified cardiac arrhythmias: Secondary | ICD-10-CM

## 2020-10-03 DIAGNOSIS — M79605 Pain in left leg: Secondary | ICD-10-CM | POA: Diagnosis present

## 2020-10-03 DIAGNOSIS — Z7901 Long term (current) use of anticoagulants: Secondary | ICD-10-CM | POA: Diagnosis not present

## 2020-10-03 DIAGNOSIS — M1A9XX0 Chronic gout, unspecified, without tophus (tophi): Secondary | ICD-10-CM | POA: Diagnosis present

## 2020-10-03 DIAGNOSIS — R042 Hemoptysis: Secondary | ICD-10-CM | POA: Diagnosis present

## 2020-10-03 DIAGNOSIS — R778 Other specified abnormalities of plasma proteins: Secondary | ICD-10-CM | POA: Diagnosis not present

## 2020-10-03 DIAGNOSIS — J129 Viral pneumonia, unspecified: Secondary | ICD-10-CM | POA: Diagnosis present

## 2020-10-03 DIAGNOSIS — Z79899 Other long term (current) drug therapy: Secondary | ICD-10-CM | POA: Diagnosis not present

## 2020-10-03 DIAGNOSIS — I513 Intracardiac thrombosis, not elsewhere classified: Secondary | ICD-10-CM | POA: Diagnosis present

## 2020-10-03 DIAGNOSIS — E1165 Type 2 diabetes mellitus with hyperglycemia: Secondary | ICD-10-CM | POA: Diagnosis present

## 2020-10-03 DIAGNOSIS — I5043 Acute on chronic combined systolic (congestive) and diastolic (congestive) heart failure: Secondary | ICD-10-CM | POA: Diagnosis present

## 2020-10-03 DIAGNOSIS — Z9119 Patient's noncompliance with other medical treatment and regimen: Secondary | ICD-10-CM | POA: Diagnosis not present

## 2020-10-03 DIAGNOSIS — Z20822 Contact with and (suspected) exposure to covid-19: Secondary | ICD-10-CM | POA: Diagnosis present

## 2020-10-03 DIAGNOSIS — G8929 Other chronic pain: Secondary | ICD-10-CM | POA: Diagnosis present

## 2020-10-03 DIAGNOSIS — M7989 Other specified soft tissue disorders: Secondary | ICD-10-CM | POA: Diagnosis present

## 2020-10-03 DIAGNOSIS — Z6841 Body Mass Index (BMI) 40.0 and over, adult: Secondary | ICD-10-CM | POA: Diagnosis not present

## 2020-10-03 DIAGNOSIS — M79604 Pain in right leg: Secondary | ICD-10-CM | POA: Diagnosis present

## 2020-10-03 DIAGNOSIS — I5022 Chronic systolic (congestive) heart failure: Secondary | ICD-10-CM | POA: Diagnosis not present

## 2020-10-03 DIAGNOSIS — E876 Hypokalemia: Secondary | ICD-10-CM | POA: Diagnosis present

## 2020-10-03 LAB — GLUCOSE, CAPILLARY
Glucose-Capillary: 223 mg/dL — ABNORMAL HIGH (ref 70–99)
Glucose-Capillary: 197 mg/dL — ABNORMAL HIGH (ref 70–99)
Glucose-Capillary: 255 mg/dL — ABNORMAL HIGH (ref 70–99)
Glucose-Capillary: 295 mg/dL — ABNORMAL HIGH (ref 70–99)
Glucose-Capillary: 236 mg/dL — ABNORMAL HIGH (ref 70–99)

## 2020-10-03 LAB — CBC
HCT: 41 % (ref 39.0–52.0)
Hemoglobin: 13.1 g/dL (ref 13.0–17.0)
MCH: 27 pg (ref 26.0–34.0)
MCHC: 32 g/dL (ref 30.0–36.0)
MCV: 84.4 fL (ref 80.0–100.0)
Platelets: 286 10*3/uL (ref 150–400)
RBC: 4.86 MIL/uL (ref 4.22–5.81)
RDW: 16.9 % — ABNORMAL HIGH (ref 11.5–15.5)
WBC: 14.4 10*3/uL — ABNORMAL HIGH (ref 4.0–10.5)
nRBC: 0 % (ref 0.0–0.2)

## 2020-10-03 LAB — BASIC METABOLIC PANEL
Anion gap: 8 (ref 5–15)
BUN: 15 mg/dL (ref 6–20)
CO2: 27 mmol/L (ref 22–32)
Calcium: 9.1 mg/dL (ref 8.9–10.3)
Chloride: 106 mmol/L (ref 98–111)
Creatinine, Ser: 1.03 mg/dL (ref 0.61–1.24)
GFR, Estimated: >60 mL/min (ref >60)
Glucose, Bld: 202 mg/dL — ABNORMAL HIGH (ref 70–99)
Potassium: 3.6 mmol/L (ref 3.5–5.1)
Sodium: 141 mmol/L (ref 135–145)

## 2020-10-03 LAB — APTT: aPTT: 57 seconds — ABNORMAL HIGH (ref 24–36)

## 2020-10-03 LAB — MAGNESIUM: Magnesium: 1.9 mg/dL (ref 1.7–2.4)

## 2020-10-03 LAB — HEPARIN LEVEL (UNFRACTIONATED): Heparin Unfractionated: 0.34 IU/mL (ref 0.30–0.70)

## 2020-10-03 LAB — PROTIME-INR
INR: 1 (ref 0.8–1.2)
Prothrombin Time: 13.3 seconds (ref 11.4–15.2)

## 2020-10-03 MED ORDER — HEPARIN BOLUS VIA INFUSION
1500.0000 [IU] | Freq: Once | INTRAVENOUS | Status: DC
  Filled 2020-10-03: qty 1500

## 2020-10-03 MED ORDER — HEPARIN (PORCINE) 25000 UT/250ML-% IV SOLN
1850.0000 [IU]/h | INTRAVENOUS | Status: DC
  Filled 2020-10-03: qty 250

## 2020-10-03 MED ORDER — ENOXAPARIN SODIUM 300 MG/3ML IJ SOLN
160.0000 mg | Freq: Two times a day (BID) | INTRAMUSCULAR | Status: DC
  Administered 2020-10-03 – 2020-10-04 (×3): 160 mg via SUBCUTANEOUS
  Filled 2020-10-03 (×3): qty 1.6

## 2020-10-03 MED ORDER — FUROSEMIDE 20 MG PO TABS
20.0000 mg | ORAL_TABLET | Freq: Every day | ORAL | Status: DC
  Administered 2020-10-03 – 2020-10-04 (×2): 20 mg via ORAL
  Filled 2020-10-03 (×2): qty 1

## 2020-10-03 MED ORDER — DAPAGLIFLOZIN PROPANEDIOL 10 MG PO TABS
10.0000 mg | ORAL_TABLET | Freq: Every day | ORAL | Status: DC
  Administered 2020-10-03 – 2020-10-04 (×2): 10 mg via ORAL
  Filled 2020-10-03 (×2): qty 1

## 2020-10-03 MED ORDER — WARFARIN SODIUM 5 MG PO TABS
10.0000 mg | ORAL_TABLET | Freq: Once | ORAL | Status: AC
  Administered 2020-10-03: 10 mg via ORAL
  Filled 2020-10-03: qty 2

## 2020-10-03 MED ORDER — SPIRONOLACTONE 12.5 MG HALF TABLET
12.5000 mg | ORAL_TABLET | Freq: Every day | ORAL | Status: DC
  Administered 2020-10-03 – 2020-10-04 (×2): 12.5 mg via ORAL
  Filled 2020-10-03 (×2): qty 1

## 2020-10-03 NOTE — Progress Notes (Addendum)
ANTICOAGULATION CONSULT NOTE - Follow Up Consult  Pharmacy Consult for Warfarin with Lovenox bridge Indication: Persistent LV thrombus  Allergies  Allergen Reactions  . Phenergan [Promethazine]   . Tramadol Itching    Patient Measurements: Height: 5\' 9"  (175.3 cm) Weight: (!) 160.7 kg (354 lb 4.5 oz) IBW/kg (Calculated) : 70.7 Heparin Dosing Weight: 105  Vital Signs: Temp: 97.7 F (36.5 C) (06/05 0445) Temp Source: Oral (06/05 0445) BP: 112/97 (06/05 0445) Pulse Rate: 97 (06/05 0445)  Labs: Recent Labs    10/01/20 1515 10/01/20 1635 10/01/20 1635 10/01/20 1841 10/02/20 0607 10/02/20 1350 10/02/20 2032 10/03/20 0621  HGB  --  13.6   < >  --  13.4  --   --  13.1  HCT  --  43.8  --   --  42.2  --   --  41.0  PLT  --  294  --   --  285  --   --  286  APTT  --   --   --   --   --  32 47* 57*  LABPROT  --   --   --   --   --  13.4  --  13.3  INR  --   --   --   --   --  1.0  --  1.0  HEPARINUNFRC  --   --   --   --   --  0.44  --  0.34  CREATININE 1.19  --   --   --  1.04  --   --  1.03  TROPONINIHS  --  129*  --  125*  --   --   --   --    < > = values in this interval not displayed.    Estimated Creatinine Clearance: 155.4 mL/min (by C-G formula based on SCr of 1.03 mg/dL).   Medical History: Past Medical History:  Diagnosis Date  . Gout   . Myocarditis (HCC)     Medications:  Xarelto 20 mg daily PTA  Assessment: 30 y/o M with a h/o chronic systolic and diastolic heart failure, LV thrombus (not initially started on anticoagulation in 2020), DVT (2021) started on Eliquis. Cardiology wants to convert patient to warfarin due to persistent LV thrombus despite Eliquis > Xarelto w/ evidence pointing towards better outcomes with warfarin than DOACs (though questionable compliance). Pharmacy consulted to initiate wararin dosing with IV UFH bridging. Last dose of VTE ppx Lovenox was last PM/early AM. Of note, patient is on allopurinol which may potentiate the effects  of warfarin.  Patient has extreme of body weight > 180 kg  10/03/2020.  aPTT subtherapeutic (57) on heparin 1650 units/hr  Heparin level 0.34 - does not correlate with aPTT  Cardiologist requests changing from heparin infusion to enoxaparin treatment dose  No bleeding or infusion interruptions per RN  INR subtherapeutic (1.0), yesterday's warfarin dose 10mg   Goal of Therapy:  INR 2-3 Heparin level 0.3-0.7 units/ml aPTT 66-102 seconds Monitor platelets by anticoagulation protocol: Yes  LMWH anti-Xa level 0.6-1 units/mL   Plan:  Discontinue heparin infusion Lovenox 1mg /kg subq q12h until INR therapeutic Obtain low molecular wgt heparin (fractionated) anti-Xa peak level four hours after 3rd dose of enoxaparin Warfarin 10 mg po x 1 today Monitor daily INR, CBC, s/s bleeding   Dominyck Reser P. 12/03/2020, PharmD, BCPS Clinical Pharmacist  Please utilize Amion for appropriate phone number to reach the unit pharmacist [facility]) 10/03/2020 8:46 AM

## 2020-10-03 NOTE — Progress Notes (Addendum)
Progress Note  Patient Name: Marcus Atkins Date of Encounter: 10/03/2020  [facility] Cardiologist: None   Subjective   BP 112/97 this morning. Net negative 1.5L yesterday on IV lasix 20 mg x1, negative 5.6L on admission.  Stable renal function (Cr 1.0 > 1.0). Reports dyspnea/edema resolved.  Inpatient Medications    Scheduled Meds: . allopurinol  300 mg Oral BID  . atorvastatin  80 mg Oral Daily  . carvedilol  25 mg Oral BID  . furosemide  20 mg Intravenous Daily  . insulin aspart  0-15 Units Subcutaneous TID PC,HS,0200  . insulin glargine  20 Units Subcutaneous Daily  . predniSONE  40 mg Oral Q breakfast  . sacubitril-valsartan  1 tablet Oral BID  . Warfarin - Pharmacist Dosing Inpatient   Does not apply q1600   Continuous Infusions: . heparin 1,650 Units/hr (10/03/20 0447)   PRN Meds: HYDROcodone-acetaminophen, pantoprazole   Vital Signs    Vitals:   10/02/20 1207 10/02/20 1606 10/02/20 2105 10/03/20 0445  BP: 118/90 127/90 129/87 (!) 112/97  Pulse: (!) 105 (!) 104 (!) 110 97  Resp: 18 18 18 16   Temp: 98.3 F (36.8 C) 98.6 F (37 C) 98.5 F (36.9 C) 97.7 F (36.5 C)  TempSrc:   Oral Oral  SpO2: 98% 96% 99% 98%  Weight:      Height:        Intake/Output Summary (Last 24 hours) at 10/03/2020 0834 Last data filed at 10/03/2020 0700 Gross per 24 hour  Intake 1265.45 ml  Output 2750 ml  Net -1484.55 ml   Last 3 Weights 10/02/2020 10/02/2020 10/01/2020  Weight (lbs) 354 lb 4.5 oz 356 lb 4.2 oz 320 lb  Weight (kg) 160.7 kg 161.6 kg 145.151 kg      Telemetry    NSR 90-100s, 2 second pause overnight - Personally Reviewed  ECG    No new ECG - Personally Reviewed  Physical Exam   GEN: No acute distress.   Neck: No JVD Cardiac: RRR, no murmurs, rubs, or gallops.  Respiratory: Clear to auscultation bilaterally. GI: Soft, nontender, non-distended  MS: No edema; No deformity. Neuro:  Nonfocal  Psych: Normal affect   Labs    High Sensitivity Troponin:    Recent Labs  Lab 10/01/20 1635 10/01/20 1841  TROPONINIHS 129* 125*      Chemistry Recent Labs  Lab 10/01/20 1515 10/02/20 0607 10/03/20 0621  NA 143 142 141  K 3.1* 4.0 3.6  CL 108 105 106  CO2 27 26 27   GLUCOSE 153* 275* 202*  BUN 11 13 15   CREATININE 1.19 1.04 1.03  CALCIUM 9.1 9.1 9.1  GFRNONAA >60 >60 >60  ANIONGAP 8 11 8      Hematology Recent Labs  Lab 10/01/20 1635 10/02/20 0607 10/03/20 0621  WBC 11.3* 9.4 14.4*  RBC 5.08 4.96 4.86  HGB 13.6 13.4 13.1  HCT 43.8 42.2 41.0  MCV 86.2 85.1 84.4  MCH 26.8 27.0 27.0  MCHC 31.1 31.8 32.0  RDW 17.4* 17.2* 16.9*  PLT 294 285 286    BNP Recent Labs  Lab 10/01/20 1515  BNP 127.4*     DDimer  Recent Labs  Lab 10/01/20 1515  DDIMER 0.64*     Radiology    DG Chest 2 View  Result Date: 10/01/2020 CLINICAL DATA:  Fatigue. EXAM: CHEST - 2 VIEW COMPARISON:  None. FINDINGS: Decreased lung volumes are seen which may be secondary to suboptimal patient inspiration. Increased bronchovascular lung markings are seen within  the bilateral lung bases. There is no evidence of a pleural effusion or pneumothorax. The cardiac silhouette is mildly enlarged. The visualized skeletal structures are unremarkable. IMPRESSION: Low lung volumes with bibasilar bronchovascular crowding. Correlation with follow-up chest plain film with improved patient inspiration is recommended if clinical symptoms persist. Electronically Signed   By: Aram Candela M.D.   On: 10/01/2020 16:11   CT Angio Chest PE W and/or Wo Contrast  Result Date: 10/01/2020 CLINICAL DATA:  PE suspected, shortness of breath, hemoptysis EXAM: CT ANGIOGRAPHY CHEST WITH CONTRAST TECHNIQUE: Multidetector CT imaging of the chest was performed using the standard protocol during bolus administration of intravenous contrast. Multiplanar CT image reconstructions and MIPs were obtained to evaluate the vascular anatomy. CONTRAST:  22mL OMNIPAQUE IOHEXOL 350 MG/ML SOLN  COMPARISON:  None. FINDINGS: Cardiovascular: Satisfactory opacification of the pulmonary arteries to the segmental level. No evidence of pulmonary embolism. Cardiomegaly. No pericardial effusion. Mediastinum/Nodes: No enlarged mediastinal, hilar, or axillary lymph nodes. Thyroid gland, trachea, and esophagus demonstrate no significant findings. Lungs/Pleura: Extensive bilateral heterogeneous and ground-glass airspace opacity throughout the lungs, with a bibasilar predominance. No pleural effusion or pneumothorax. Upper Abdomen: No acute abnormality. Musculoskeletal: No chest wall abnormality. No acute or significant osseous findings. Review of the MIP images confirms the above findings. IMPRESSION: 1. Negative examination for pulmonary embolism. 2. Extensive bilateral heterogeneous and ground-glass airspace opacity throughout the lungs, with a bibasilar predominance. Findings are consistent with multifocal infection, including COVID-19 if clinically suspected. 3. Cardiomegaly. Electronically Signed   By: Lauralyn Primes M.D.   On: 10/01/2020 19:54   ECHOCARDIOGRAM COMPLETE  Result Date: 10/02/2020    ECHOCARDIOGRAM REPORT   Patient Name:   KODEN HUNZEKER Date of Exam: 10/02/2020 Medical Rec #:  [id number]   Height:       69.0 in Accession #:    [id number]  Weight:       354.3 lb Date of Birth:  [birth year]   BSA:          2.634 m Patient Age:    30 years    BP:           122/95 mmHg Patient Gender: M           HR:           106 bpm. Exam Location:  Inpatient Procedure: 2D Echo, Cardiac Doppler, Color Doppler and Intracardiac            Opacification Agent Indications:    Elevated troponin  History:        Patient has no prior history of Echocardiogram examinations.                 Risk Factors:Morbid obesity. Coughing. Lower extremity edema.                 [place]. Was previsouly                 seen by cardiology in [place]. H/O DVT.  Sonographer:    Roosvelt Maser RDCS Referring Phys:  [id number] CHING T TU  Sonographer Comments: Patient is morbidly obese. IMPRESSIONS  1. There is a protruding, but relatively fixed moderate size apical thrombus (2.4 x 1 cm). Left ventricular ejection fraction, by estimation, is 25 to 30%. The left ventricle has severely decreased function. The left ventricle demonstrates global hypokinesis. The left ventricular internal cavity size was severely dilated. Left ventricular diastolic parameters are consistent with Grade III diastolic dysfunction (restrictive).  Elevated left atrial pressure.  2. Right ventricular systolic function is normal. The right ventricular size is normal. Tricuspid regurgitation signal is inadequate for assessing PA pressure.  3. Left atrial size was moderately dilated.  4. The mitral valve is normal in structure. Mild to moderate mitral valve regurgitation.  5. The aortic valve is tricuspid. Aortic valve regurgitation is not visualized. No aortic stenosis is present.  6. The inferior vena cava is normal in size with greater than 50% respiratory variability, suggesting right atrial pressure of 3 mmHg. Comparison(s): No prior Echocardiogram. FINDINGS  Left Ventricle: There is a protruding, but relatively fixed moderate size apical thrombus (2.4 x 1 cm). Left ventricular ejection fraction, by estimation, is 25 to 30%. The left ventricle has severely decreased function. The left ventricle demonstrates global hypokinesis. Definity contrast agent was given IV to delineate the left ventricular endocardial borders. The left ventricular internal cavity size was severely dilated. There is no left ventricular hypertrophy. Left ventricular diastolic parameters are consistent with Grade III diastolic dysfunction (restrictive). Elevated left atrial pressure. Right Ventricle: The right ventricular size is normal. No increase in right ventricular wall thickness. Right ventricular systolic function is normal. Tricuspid regurgitation signal is inadequate for  assessing PA pressure. Left Atrium: Left atrial size was moderately dilated. Right Atrium: Right atrial size was normal in size. Pericardium: There is no evidence of pericardial effusion. Mitral Valve: The mitral valve is normal in structure. Mild to moderate mitral valve regurgitation, with centrally-directed jet. Tricuspid Valve: The tricuspid valve is normal in structure. Tricuspid valve regurgitation is not demonstrated. Aortic Valve: The aortic valve is tricuspid. Aortic valve regurgitation is not visualized. No aortic stenosis is present. Pulmonic Valve: The pulmonic valve was grossly normal. Pulmonic valve regurgitation is not visualized. Aorta: The aortic root is normal in size and structure. Venous: The inferior vena cava is normal in size with greater than 50% respiratory variability, suggesting right atrial pressure of 3 mmHg. IAS/Shunts: No atrial level shunt detected by color flow Doppler.  LEFT VENTRICLE PLAX 2D LVIDd:         7.00 cm LVIDs:         6.10 cm LV PW:         1.00 cm LV IVS:        1.00 cm LVOT diam:     2.20 cm LV SV:         29 LV SV Index:   11 LVOT Area:     3.80 cm  RIGHT VENTRICLE             IVC RV Basal diam:  5.60 cm     IVC diam: 2.00 cm RV Mid diam:    3.70 cm RV S prime:     13.50 cm/s TAPSE (M-mode): 2.7 cm LEFT ATRIUM             Index       RIGHT ATRIUM           Index LA diam:        4.80 cm 1.82 cm/m  RA Area:     25.60 cm LA Vol (A2C):   84.9 ml 32.23 ml/m RA Volume:   85.30 ml  32.38 ml/m LA Vol (A4C):   79.5 ml 30.18 ml/m LA Biplane Vol: 88.4 ml 33.56 ml/m  AORTIC VALVE LVOT Vmax:   47.70 cm/s LVOT Vmean:  33.800 cm/s LVOT VTI:    0.076 m  AORTA Ao Root diam: 2.90 cm MITRAL VALVE MV Area (  PHT): 7.44 cm    SHUNTS MV Decel Time: 102 msec    Systemic VTI:  0.08 m MV E velocity: 89.50 cm/s  Systemic Diam: 2.20 cm MV A velocity: 60.80 cm/s MV E/A ratio:  1.47 Mihai Croitoru MD Electronically signed by Thurmon FairMihai Croitoru MD Signature Date/Time: 10/02/2020/11:45:14 AM     Final    VAS US LOWER EXTREMITY VENOUS (DVT) (ONLY [facility] & [facility] 7a-7p)  Result Date: 10/02/2020  Lower Venous DVT Study Patient Name:  Lenward ChancellorCORDEZ Brandvold  Date of Exam:   10/01/2020 Medical Rec #: [id number]    Accession #:    [id number] Date of Birth: 11-08-1990    Patient Gender: M Patient Age:   30Y [facility] Procedure:      VAS US LOWER EXTREMITY VENOUS (DVT) Referring Phys: [id number] BOWIE TRAN --------------------------------------------------------------------------------  Indications: Swelling, and SOB.  Comparison Study: No prior. Performing Technologist: Marilynne Halstedita Sturdivant RDMS, RVT  Examination Guidelines: A complete evaluation includes B-mode imaging, spectral Doppler, color Doppler, and power Doppler as needed of all accessible portions of each vessel. Bilateral testing is considered an integral part of a complete examination. Limited examinations for reoccurring indications may be performed as noted. The reflux portion of the exam is performed with the patient in reverse Trendelenburg.  +---------+---------------+---------+-----------+----------+--------------+ RIGHT    CompressibilityPhasicitySpontaneityPropertiesThrombus Aging +---------+---------------+---------+-----------+----------+--------------+ CFV      Full           Yes      Yes                                 +---------+---------------+---------+-----------+----------+--------------+ SFJ      Full                                                        +---------+---------------+---------+-----------+----------+--------------+ FV Prox  Full                                                        +---------+---------------+---------+-----------+----------+--------------+ FV Mid   Full                                                        +---------+---------------+---------+-----------+----------+--------------+ FV DistalFull                                                         +---------+---------------+---------+-----------+----------+--------------+ PFV      Full                                                        +---------+---------------+---------+-----------+----------+--------------+ POP      Full  Yes      Yes                                 +---------+---------------+---------+-----------+----------+--------------+ PTV      Full                                                        +---------+---------------+---------+-----------+----------+--------------+ PERO     Full                                                        +---------+---------------+---------+-----------+----------+--------------+   +----+---------------+---------+-----------+----------+--------------+ LEFTCompressibilityPhasicitySpontaneityPropertiesThrombus Aging +----+---------------+---------+-----------+----------+--------------+ CFV Full           Yes      Yes                                 +----+---------------+---------+-----------+----------+--------------+ SFJ Full                                                        +----+---------------+---------+-----------+----------+--------------+     Summary: RIGHT: - There is no evidence of deep vein thrombosis in the lower extremity.  - No cystic structure found in the popliteal fossa.  LEFT: - No evidence of common femoral vein obstruction.  *See table(s) above for measurements and observations. Electronically signed by Fabienne Bruns MD on 10/02/2020 at 12:01:08 PM.    Final     Cardiac Studies   Echo 10/02/20: 1. There is a protruding, but relatively fixed moderate size apical  thrombus (2.4 x 1 cm). Left ventricular ejection fraction, by estimation,  is 25 to 30%. The left ventricle has severely decreased function. The left  ventricle demonstrates global  hypokinesis. The left ventricular internal cavity size was severely  dilated. Left ventricular diastolic parameters are consistent  with Grade  III diastolic dysfunction (restrictive). Elevated left atrial pressure.  2. Right ventricular systolic function is normal. The right ventricular  size is normal. Tricuspid regurgitation signal is inadequate for assessing  PA pressure.  3. Left atrial size was moderately dilated.  4. The mitral valve is normal in structure. Mild to moderate mitral valve  regurgitation.  5. The aortic valve is tricuspid. Aortic valve regurgitation is not  visualized. No aortic stenosis is present.  6. The inferior vena cava is normal in size with greater than 50%  respiratory variability, suggesting right atrial pressure of 3 mmHg.   Patient Profile     30 y.o. 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  Coronary CTA showed no CAD.  Cardiac MRI  Cardiac MRI on 07/16/2018 showed severe LV systolic dysfunction (EF 14%), small LV thrombus, mildly reduced RV function (EF 41%), RV insertion site LGE, elevated T1/T2/ECV suggesting myocarditis.  He was discharged with LifeVest but stopped wearing and declined ICD.  Followed with advanced heart failure in [place] of [place], last seen 10/2018.  Has not seen a cardiologist since that time.  Moved to [place] 1 week ago.  Reported onset of cough/shortness of breath and lower extremity edema a few days ago.  Echocardiogram today shows LVEF 25 to 30%, grade 3 diastolic dysfunction, normal RV function, mild to moderate MR, LV apical thrombus. - Reports symptoms have resolved with diuresis and appears euvolemic, though difficult to assess given body habitus.  IVC small/collapsible on echo. Will switch to PO lasix -Continue carvedilol 25 mg  twice daily -Continue Entresto 24-26 mg twice daily -Start Farxiga 10 mg daily -Start spironolactone 12.5 mg daily -Will plan follow-up with Advanced Heart Failure on discharge  LV apical thrombus: Initially seen on cardiac MRI 06/2018, but appears was not started on anticoagulation at that time.  While in [place] in 2021, he developed a DVT during a period of immobility from a gout flare.  He was started on Eliquis.  Reports he had been taking Eliquis until he moved back to [place] and established with a new PCP who about 2 weeks ago switched him to Xarelto -Given persistent LV thrombus despite taking Eliquis last year and recently switched to Xarelto (though questionable compliance), and given there is some evidence that warfarin is associated with better outcomes for LV thrombus than DOACs, would favor starting warfarin.  Did discuss with patient that this will require frequent INR checks at first and he is agreeable to doing this.  Started on heparin gtt and warfarin per pharmacy.  Patient is adamant about discharge tomorrow.  Discussed with pharmacy, will switch to lovenox injections to bridge at discharge.  Pharmacy recommending checking Factor Xa level tomorrow on lovenox to ensure adequate anticoagulation given his obesity.  Will need close follow-up in Coumadin clinic.  Bradycardia: sinus tachycardia during day, noted to have bradyarrhythmia overnight, had 2 second pause last night and Wenckebach the previous night.  Suspect untreated OSA.  Continue to monitor, will need outpatient sleep study and likely monitor on discharge.  For questions or updates, please contact [facility] Please consult [email or web address] for contact info under        Signed, Little Ishikawa, MD  10/03/2020, 8:34 AM

## 2020-10-03 NOTE — Progress Notes (Signed)
PROGRESS NOTE    Marcus Atkins  ZOX:[id number] DOB: Oct 13, 1990 DOA: 10/01/2020 PCP: Pcp, No   Chief Complain: Cough, shortness of breath  Brief Narrative: Patient is a 30 year old male with history of myocarditis, chronic systolic congestive heart failure, type 2 diabetes, COVID in 2020, gout, history of DVT on Xarelto, morbidly obese who presented here to the emergency room with complaints of cough, shortness of breath worsening with exertion, bilateral lower extremity edema.  He also reported left elbow pain from gout exacerbation.  On presentation he had mild grade temperature, tachycardic, tachypneic, not hypoxic.  Troponins were mildly elevated.  EKG showed left ventricular hypertrophy.  Venous Doppler was negative for DVT bilaterally.  CT angiogram showed bilateral groundglass opacity consistent with fluid overload versus atypical pneumonia.  COVID screen test, flu test negative.  Echo done here showed moderate size apical thrombus, left ventricle ejection fraction of 25 to 30%. Cardiology consulted.  Assessment & Plan:   Principal Problem:   Elevated troponin Active Problems:   Gout attack   Viral pneumonia   Hypokalemia   DM (diabetes mellitus), type 2 (HCC)   Obesity, Class III, BMI 40-49.9 (morbid obesity) (HCC)   Apical thrombus: Echo showed 2.4 X1 centimeter thrombus.  Cardiology is following.  Currently on heparin and warfarin bridge.  Plan is to put him on warfarin on discharge.  He was previously taking anticoagulation but not now. Patient likes to be discharged tomorrow.  Bridging switched to Lovenox from heparin GTT.  He might need few doses of Lovenox at home,we will check with pharmacy  Acute on chronic combined systolic/diastolic congestive heart failure: History of myocarditis.  Echo done yesterday actually showed 25 to 30%, severely decreased left ventricular function, global hypokinesis, dilation of left ventricular internal cavity.  Started on Entresto, lasix,  carvedilol.  Also started on Farxiga,spironolactone. Continue monitor daily weight, input/output.  He does not have signs of acute heart failure.  But complains of cough and dyspnea on exertion. He was following with advanced heart failure at [place] [facility] [place] but recently moved to [facility].  Cardiac MRI on 3/70/20 showed severe left ventricular systolic dysfunction with ejection fraction of 14%, a small left ventricular thrombus.  He was discharged on LifeVest but he was not started on anticoagulation for his left ventricular thrombus.  Currently he is not following with cardiology.  He needs to follow-up with advanced heart failure clinic on discharge.  Abnormal chest imaging: CT angiogram showed bibasilar opacity concerning for atypical pneumonia or viral pneumonia.  Procalcitonin negative.  He has some productive cough, afebrile.  Chest findings are most likely secondary to fluid/congestive heart failure.  Antibiotics discontinued.  Respiratory viral panel negative.He has mild leucocytosis.  Acute on chronic gout flare: Has erythema, tenderness of left elbow.  Takes allopurinol at home.  Started on oral prednisone  Hypokalemia: Supplemented with potassium and corrected  Diabetes type 2: On sliding's insulin and Lantus.  He is hyperglycemic likely secondary to stress.  Monitor blood sugars.  We are following up new hemoglobin A1c level.    History of remote DVT: Taking Xarelto at home.  Anticoagulation changed to warfarin.  Chronic bilateral lower extremity pain: Continue supportive care, pain management  Morbid obesity: BMI of 52.  Bradycardia: Observed during night.  Likely associated with OSA.  He needs sleep study as an outpatient.  Symptomatic           DVT prophylaxis:Heparin/warfarin Code Status:Full  Family Communication: None at bedside Status is: Observation  The patient  remains OBS appropriate and will d/c before 2 midnights.  Dispo: The patient is from:  Home              Anticipated d/c is to: Home              Patient currently is not medically stable to d/c.   Difficult to place patient No     Consultants: Cardiology  Procedures:Echo  Antimicrobials:  Anti-infectives (From admission, onward)   Start     Dose/Rate Route Frequency Ordered Stop   10/02/20 1130  levofloxacin (LEVAQUIN) tablet 750 mg  Status:  Discontinued        750 mg Oral Daily 10/02/20 1036 10/02/20 1305      Subjective:  Patient seen and examined at the bedside this morning.  On room air.  Denies any respiratory complaints.  No cough or shortness of breath.  Denies any chest pain.  Very eager to go home.   Objective: Vitals:   10/02/20 1207 10/02/20 1606 10/02/20 2105 10/03/20 0445  BP: 118/90 127/90 129/87 (!) 112/97  Pulse: (!) 105 (!) 104 (!) 110 97  Resp: 18 18 18 16   Temp: 98.3 F (36.8 C) 98.6 F (37 C) 98.5 F (36.9 C) 97.7 F (36.5 C)  TempSrc:   Oral Oral  SpO2: 98% 96% 99% 98%  Weight:      Height:        Intake/Output Summary (Last 24 hours) at 10/03/2020 0847 Last data filed at 10/03/2020 0700 Gross per 24 hour  Intake 1265.45 ml  Output 2750 ml  Net -1484.55 ml   Filed Weights   10/01/20 1518 10/02/20 0020 10/02/20 0500  Weight: (!) 145.2 kg (!) 161.6 kg (!) 160.7 kg    Examination:  General exam: Overall comfortable, not in distress, morbidly obese HEENT: PERRL Respiratory system:  no wheezes or crackles  Cardiovascular system: S1 & S2 heard, RRR.  Gastrointestinal system: Abdomen is nondistended, soft and nontender. Central nervous system: Alert and oriented Extremities: No edema, no clubbing ,no cyanosis Skin: No rashes, no ulcers,no icterus    Data Reviewed: I have personally reviewed following labs and imaging studies  CBC: Recent Labs  Lab 10/01/20 1635 10/02/20 0607 10/03/20 0621  WBC 11.3* 9.4 14.4*  NEUTROABS 7.3  --   --   HGB 13.6 13.4 13.1  HCT 43.8 42.2 41.0  MCV 86.2 85.1 84.4  PLT 294 285 286    Basic Metabolic Panel: Recent Labs  Lab 10/01/20 1515 10/02/20 0607 10/03/20 0621  NA 143 142 141  K 3.1* 4.0 3.6  CL 108 105 106  CO2 27 26 27   GLUCOSE 153* 275* 202*  BUN 11 13 15   CREATININE 1.19 1.04 1.03  CALCIUM 9.1 9.1 9.1  MG  --   --  1.9   GFR: Estimated Creatinine Clearance: 155.4 mL/min (by C-G formula based on SCr of 1.03 mg/dL). Liver Function Tests: No results for input(s): AST, ALT, ALKPHOS, BILITOT, PROT, ALBUMIN in the last 168 hours. No results for input(s): LIPASE, AMYLASE in the last 168 hours. No results for input(s): AMMONIA in the last 168 hours. Coagulation Profile: Recent Labs  Lab 10/02/20 1350 10/03/20 0621  INR 1.0 1.0   Cardiac Enzymes: No results for input(s): CKTOTAL, CKMB, CKMBINDEX, TROPONINI in the last 168 hours. BNP (last 3 results) No results for input(s): PROBNP in the last 8760 hours. HbA1C: No results for input(s): HGBA1C in the last 72 hours. CBG: Recent Labs  Lab 10/02/20 1209 10/02/20  1638 10/02/20 2113 10/03/20 0303 10/03/20 0742  GLUCAP 365* 266* 229* 223* 197*   Lipid Profile: No results for input(s): CHOL, HDL, LDLCALC, TRIG, CHOLHDL, LDLDIRECT in the last 72 hours. Thyroid Function Tests: No results for input(s): TSH, T4TOTAL, FREET4, T3FREE, THYROIDAB in the last 72 hours. Anemia Panel: No results for input(s): VITAMINB12, FOLATE, FERRITIN, TIBC, IRON, RETICCTPCT in the last 72 hours. Sepsis Labs: Recent Labs  Lab 10/02/20 0121  PROCALCITON <0.10    Recent Results (from the past 240 hour(s))  Resp Panel by RT-PCR (Flu A&B, Covid) Nasopharyngeal Swab     Status: None   Collection Time: 10/01/20  4:35 PM   Specimen: Nasopharyngeal Swab; Nasopharyngeal(NP) swabs in vial transport medium  Result Value Ref Range Status   SARS Coronavirus 2 by RT PCR NEGATIVE NEGATIVE Final    Comment: (NOTE) SARS-CoV-2 target nucleic acids are NOT DETECTED.  The SARS-CoV-2 RNA is generally detectable in upper  respiratory specimens during the acute phase of infection. The lowest concentration of SARS-CoV-2 viral copies this assay can detect is 138 copies/mL. A negative result does not preclude SARS-Cov-2 infection and should not be used as the sole basis for treatment or other patient management decisions. A negative result may occur with  improper specimen collection/handling, submission of specimen other than nasopharyngeal swab, presence of viral mutation(s) within the areas targeted by this assay, and inadequate number of viral copies(<138 copies/mL). A negative result must be combined with clinical observations, patient history, and epidemiological information. The expected result is Negative.  Fact Sheet for Patients:  BloggerCourse.comhttps://www.fda.gov/media/152166/download  Fact Sheet for Healthcare Providers:  SeriousBroker.ithttps://www.fda.gov/media/152162/download  This test is no t yet approved or cleared by the [place] FDA and  has been authorized for detection and/or diagnosis of SARS-CoV-2 by FDA under an Emergency Use Authorization (EUA). This EUA will remain  in effect (meaning this test can be used) for the duration of the COVID-19 declaration under Section 564(b)(1) of the Act, 21 U.S.C.section 360bbb-3(b)(1), unless the authorization is terminated  or revoked sooner.       Influenza A by PCR NEGATIVE NEGATIVE Final   Influenza B by PCR NEGATIVE NEGATIVE Final    Comment: (NOTE) The Xpert Xpress SARS-CoV-2/FLU/RSV plus assay is intended as an aid in the diagnosis of influenza from Nasopharyngeal swab specimens and should not be used as a sole basis for treatment. Nasal washings and aspirates are unacceptable for Xpert Xpress SARS-CoV-2/FLU/RSV testing.  Fact Sheet for Patients: BloggerCourse.comhttps://www.fda.gov/media/152166/download  Fact Sheet for Healthcare Providers: SeriousBroker.ithttps://www.fda.gov/media/152162/download  This test is not yet approved or cleared by the [place] FDA and has been  authorized for detection and/or diagnosis of SARS-CoV-2 by FDA under an Emergency Use Authorization (EUA). This EUA will remain in effect (meaning this test can be used) for the duration of the COVID-19 declaration under Section 564(b)(1) of the Act, 21 U.S.C. section 360bbb-3(b)(1), unless the authorization is terminated or revoked.  Performed at [facility], [address] [place], [place] [address]   Respiratory (~20 pathogens) panel by PCR     Status: None   Collection Time: 10/01/20  9:12 PM   Specimen: Nasopharyngeal Swab; Respiratory  Result Value Ref Range Status   Adenovirus NOT DETECTED NOT DETECTED Final   Coronavirus 229E NOT DETECTED NOT DETECTED Final    Comment: (NOTE) The Coronavirus on the Respiratory Panel, DOES NOT test for the novel  Coronavirus (2019 nCoV)    Coronavirus HKU1 NOT DETECTED NOT DETECTED Final   Coronavirus NL63  NOT DETECTED NOT DETECTED Final   Coronavirus OC43 NOT DETECTED NOT DETECTED Final   Metapneumovirus NOT DETECTED NOT DETECTED Final   Rhinovirus / Enterovirus NOT DETECTED NOT DETECTED Final   Influenza A NOT DETECTED NOT DETECTED Final   Influenza B NOT DETECTED NOT DETECTED Final   Parainfluenza Virus 1 NOT DETECTED NOT DETECTED Final   Parainfluenza Virus 2 NOT DETECTED NOT DETECTED Final   Parainfluenza Virus 3 NOT DETECTED NOT DETECTED Final   Parainfluenza Virus 4 NOT DETECTED NOT DETECTED Final   Respiratory Syncytial Virus NOT DETECTED NOT DETECTED Final   Bordetella pertussis NOT DETECTED NOT DETECTED Final   Bordetella Parapertussis NOT DETECTED NOT DETECTED Final   Chlamydophila pneumoniae NOT DETECTED NOT DETECTED Final   Mycoplasma pneumoniae NOT DETECTED NOT DETECTED Final    Comment: Performed at [facility], [address] [place], [place] [address]         Radiology Studies: DG Chest 2 View  Result Date: 10/01/2020 CLINICAL DATA:  Fatigue. EXAM: CHEST - 2 VIEW COMPARISON:  None.  FINDINGS: Decreased lung volumes are seen which may be secondary to suboptimal patient inspiration. Increased bronchovascular lung markings are seen within the bilateral lung bases. There is no evidence of a pleural effusion or pneumothorax. The cardiac silhouette is mildly enlarged. The visualized skeletal structures are unremarkable. IMPRESSION: Low lung volumes with bibasilar bronchovascular crowding. Correlation with follow-up chest plain film with improved patient inspiration is recommended if clinical symptoms persist. Electronically Signed   By: Aram Candela M.D.   On: 10/01/2020 16:11   CT Angio Chest PE W and/or Wo Contrast  Result Date: 10/01/2020 CLINICAL DATA:  PE suspected, shortness of breath, hemoptysis EXAM: CT ANGIOGRAPHY CHEST WITH CONTRAST TECHNIQUE: Multidetector CT imaging of the chest was performed using the standard protocol during bolus administration of intravenous contrast. Multiplanar CT image reconstructions and MIPs were obtained to evaluate the vascular anatomy. CONTRAST:  26mL OMNIPAQUE IOHEXOL 350 MG/ML SOLN COMPARISON:  None. FINDINGS: Cardiovascular: Satisfactory opacification of the pulmonary arteries to the segmental level. No evidence of pulmonary embolism. Cardiomegaly. No pericardial effusion. Mediastinum/Nodes: No enlarged mediastinal, hilar, or axillary lymph nodes. Thyroid gland, trachea, and esophagus demonstrate no significant findings. Lungs/Pleura: Extensive bilateral heterogeneous and ground-glass airspace opacity throughout the lungs, with a bibasilar predominance. No pleural effusion or pneumothorax. Upper Abdomen: No acute abnormality. Musculoskeletal: No chest wall abnormality. No acute or significant osseous findings. Review of the MIP images confirms the above findings. IMPRESSION: 1. Negative examination for pulmonary embolism. 2. Extensive bilateral heterogeneous and ground-glass airspace opacity throughout the lungs, with a bibasilar predominance.  Findings are consistent with multifocal infection, including COVID-19 if clinically suspected. 3. Cardiomegaly. Electronically Signed   By: Lauralyn Primes M.D.   On: 10/01/2020 19:54   ECHOCARDIOGRAM COMPLETE  Result Date: 10/02/2020    ECHOCARDIOGRAM REPORT   Patient Name:   Marcus Atkins Date of Exam: 10/02/2020 Medical Rec #:  [id number]   Height:       69.0 in Accession #:    [id number]  Weight:       354.3 lb Date of Birth:  [birth year]   BSA:          2.634 m Patient Age:    30 years    BP:           122/95 mmHg Patient Gender: M           HR:           106 bpm.  Exam Location:  Inpatient Procedure: 2D Echo, Cardiac Doppler, Color Doppler and Intracardiac            Opacification Agent Indications:    Elevated troponin  History:        Patient has no prior history of Echocardiogram examinations.                 Risk Factors:Morbid obesity. Coughing. Lower extremity edema.                 [place]. Was previsouly                 seen by cardiology in [place]. H/O DVT.  Sonographer:    Roosvelt Maser RDCS Referring Phys: [id number] CHING T TU  Sonographer Comments: Patient is morbidly obese. IMPRESSIONS  1. There is a protruding, but relatively fixed moderate size apical thrombus (2.4 x 1 cm). Left ventricular ejection fraction, by estimation, is 25 to 30%. The left ventricle has severely decreased function. The left ventricle demonstrates global hypokinesis. The left ventricular internal cavity size was severely dilated. Left ventricular diastolic parameters are consistent with Grade III diastolic dysfunction (restrictive). Elevated left atrial pressure.  2. Right ventricular systolic function is normal. The right ventricular size is normal. Tricuspid regurgitation signal is inadequate for assessing PA pressure.  3. Left atrial size was moderately dilated.  4. The mitral valve is normal in structure. Mild to moderate mitral valve regurgitation.  5. The aortic valve is tricuspid. Aortic  valve regurgitation is not visualized. No aortic stenosis is present.  6. The inferior vena cava is normal in size with greater than 50% respiratory variability, suggesting right atrial pressure of 3 mmHg. Comparison(s): No prior Echocardiogram. FINDINGS  Left Ventricle: There is a protruding, but relatively fixed moderate size apical thrombus (2.4 x 1 cm). Left ventricular ejection fraction, by estimation, is 25 to 30%. The left ventricle has severely decreased function. The left ventricle demonstrates global hypokinesis. Definity contrast agent was given IV to delineate the left ventricular endocardial borders. The left ventricular internal cavity size was severely dilated. There is no left ventricular hypertrophy. Left ventricular diastolic parameters are consistent with Grade III diastolic dysfunction (restrictive). Elevated left atrial pressure. Right Ventricle: The right ventricular size is normal. No increase in right ventricular wall thickness. Right ventricular systolic function is normal. Tricuspid regurgitation signal is inadequate for assessing PA pressure. Left Atrium: Left atrial size was moderately dilated. Right Atrium: Right atrial size was normal in size. Pericardium: There is no evidence of pericardial effusion. Mitral Valve: The mitral valve is normal in structure. Mild to moderate mitral valve regurgitation, with centrally-directed jet. Tricuspid Valve: The tricuspid valve is normal in structure. Tricuspid valve regurgitation is not demonstrated. Aortic Valve: The aortic valve is tricuspid. Aortic valve regurgitation is not visualized. No aortic stenosis is present. Pulmonic Valve: The pulmonic valve was grossly normal. Pulmonic valve regurgitation is not visualized. Aorta: The aortic root is normal in size and structure. Venous: The inferior vena cava is normal in size with greater than 50% respiratory variability, suggesting right atrial pressure of 3 mmHg. IAS/Shunts: No atrial level shunt  detected by color flow Doppler.  LEFT VENTRICLE PLAX 2D LVIDd:         7.00 cm LVIDs:         6.10 cm LV PW:         1.00 cm LV IVS:        1.00 cm LVOT  diam:     2.20 cm LV SV:         29 LV SV Index:   11 LVOT Area:     3.80 cm  RIGHT VENTRICLE             IVC RV Basal diam:  5.60 cm     IVC diam: 2.00 cm RV Mid diam:    3.70 cm RV S prime:     13.50 cm/s TAPSE (M-mode): 2.7 cm LEFT ATRIUM             Index       RIGHT ATRIUM           Index LA diam:        4.80 cm 1.82 cm/m  RA Area:     25.60 cm LA Vol (A2C):   84.9 ml 32.23 ml/m RA Volume:   85.30 ml  32.38 ml/m LA Vol (A4C):   79.5 ml 30.18 ml/m LA Biplane Vol: 88.4 ml 33.56 ml/m  AORTIC VALVE LVOT Vmax:   47.70 cm/s LVOT Vmean:  33.800 cm/s LVOT VTI:    0.076 m  AORTA Ao Root diam: 2.90 cm MITRAL VALVE MV Area (PHT): 7.44 cm    SHUNTS MV Decel Time: 102 msec    Systemic VTI:  0.08 m MV E velocity: 89.50 cm/s  Systemic Diam: 2.20 cm MV A velocity: 60.80 cm/s MV E/A ratio:  1.47 Mihai Croitoru MD Electronically signed by Thurmon Fair MD Signature Date/Time: 10/02/2020/11:45:14 AM    Final    VAS [place] LOWER EXTREMITY VENOUS (DVT) (ONLY [facility] & [facility] 7a-7p)  Result Date: 10/02/2020  Lower Venous DVT Study Patient Name:  Marcus Atkins  Date of Exam:   10/01/2020 Medical Rec #: [id number]    Accession #:    [id number] Date of Birth: November 23, 1990    Patient Gender: M Patient Age:   30Y Exam Location[facility]  [facility] Procedure:      VAS [place] LOWER EXTREMITY VENOUS (DVT) Referring Phys: [id number] BOWIE TRAN --------------------------------------------------------------------------------  Indications: Swelling, and SOB.  Comparison Study: No prior. Performing Technologist: Marilynne Halsted RDMS, RVT  Examination Guidelines: A complete evaluation includes B-mode imaging, spectral Doppler, color Doppler, and power Doppler as needed of all accessible portions of each vessel. Bilateral testing is considered an integral part of a complete examination. Limited examinations for  reoccurring indications may be performed as noted. The reflux portion of the exam is performed with the patient in reverse Trendelenburg.  +---------+---------------+---------+-----------+----------+--------------+ RIGHT    CompressibilityPhasicitySpontaneityPropertiesThrombus Aging +---------+---------------+---------+-----------+----------+--------------+ CFV      Full           Yes      Yes                                 +---------+---------------+---------+-----------+----------+--------------+ SFJ      Full                                                        +---------+---------------+---------+-----------+----------+--------------+ FV Prox  Full                                                        +---------+---------------+---------+-----------+----------+--------------+  FV Mid   Full                                                        +---------+---------------+---------+-----------+----------+--------------+ FV DistalFull                                                        +---------+---------------+---------+-----------+----------+--------------+ PFV      Full                                                        +---------+---------------+---------+-----------+----------+--------------+ POP      Full           Yes      Yes                                 +---------+---------------+---------+-----------+----------+--------------+ PTV      Full                                                        +---------+---------------+---------+-----------+----------+--------------+ PERO     Full                                                        +---------+---------------+---------+-----------+----------+--------------+   +----+---------------+---------+-----------+----------+--------------+ LEFTCompressibilityPhasicitySpontaneityPropertiesThrombus Aging +----+---------------+---------+-----------+----------+--------------+  CFV Full           Yes      Yes                                 +----+---------------+---------+-----------+----------+--------------+ SFJ Full                                                        +----+---------------+---------+-----------+----------+--------------+     Summary: RIGHT: - There is no evidence of deep vein thrombosis in the lower extremity.  - No cystic structure found in the popliteal fossa.  LEFT: - No evidence of common femoral vein obstruction.  *See table(s) above for measurements and observations. Electronically signed by Fabienne Bruns MD on 10/02/2020 at 12:01:08 PM.    Final         Scheduled Meds: . allopurinol  300 mg Oral BID  . atorvastatin  80 mg Oral Daily  . carvedilol  25 mg Oral BID  . dapagliflozin propanediol  10 mg Oral Daily  . furosemide  20 mg Oral Daily  . heparin  1,500 Units Intravenous  Once  . insulin aspart  0-15 Units Subcutaneous TID PC,HS,0200  . insulin glargine  20 Units Subcutaneous Daily  . predniSONE  40 mg Oral Q breakfast  . sacubitril-valsartan  1 tablet Oral BID  . spironolactone  12.5 mg Oral Daily  . Warfarin - Pharmacist Dosing Inpatient   Does not apply q1600   Continuous Infusions: . heparin       LOS: 0 days    Time spent:35 mins. More than 50% of that time was spent in counseling and/or coordination of care.      Burnadette Pop, MD [facility] P6/08/2020, 8:47 AM

## 2020-10-04 ENCOUNTER — Other Ambulatory Visit (HOSPITAL_COMMUNITY): Payer: Self-pay

## 2020-10-04 DIAGNOSIS — I5022 Chronic systolic (congestive) heart failure: Secondary | ICD-10-CM

## 2020-10-04 LAB — CBC
HCT: 45.3 % (ref 39.0–52.0)
Hemoglobin: 13.9 g/dL (ref 13.0–17.0)
MCH: 26.3 pg (ref 26.0–34.0)
MCHC: 30.7 g/dL (ref 30.0–36.0)
MCV: 85.8 fL (ref 80.0–100.0)
Platelets: 346 10*3/uL (ref 150–400)
RBC: 5.28 MIL/uL (ref 4.22–5.81)
RDW: 17.1 % — ABNORMAL HIGH (ref 11.5–15.5)
WBC: 12.9 10*3/uL — ABNORMAL HIGH (ref 4.0–10.5)
nRBC: 0 % (ref 0.0–0.2)

## 2020-10-04 LAB — BASIC METABOLIC PANEL
Anion gap: 9 (ref 5–15)
BUN: 20 mg/dL (ref 6–20)
CO2: 28 mmol/L (ref 22–32)
Calcium: 9.8 mg/dL (ref 8.9–10.3)
Chloride: 105 mmol/L (ref 98–111)
Creatinine, Ser: 1.18 mg/dL (ref 0.61–1.24)
GFR, Estimated: >60 mL/min (ref >60)
Glucose, Bld: 202 mg/dL — ABNORMAL HIGH (ref 70–99)
Potassium: 4.1 mmol/L (ref 3.5–5.1)
Sodium: 142 mmol/L (ref 135–145)

## 2020-10-04 LAB — PROTIME-INR
INR: 1.1 (ref 0.8–1.2)
Prothrombin Time: 13.9 seconds (ref 11.4–15.2)

## 2020-10-04 LAB — HEMOGLOBIN A1C
Hgb A1c MFr Bld: 9.3 % — ABNORMAL HIGH (ref 4.8–5.6)
Mean Plasma Glucose: 220 mg/dL

## 2020-10-04 LAB — GLUCOSE, CAPILLARY
Glucose-Capillary: 225 mg/dL — ABNORMAL HIGH (ref 70–99)
Glucose-Capillary: 160 mg/dL — ABNORMAL HIGH (ref 70–99)
Glucose-Capillary: 207 mg/dL — ABNORMAL HIGH (ref 70–99)

## 2020-10-04 MED ORDER — WARFARIN SODIUM 5 MG PO TABS
10.0000 mg | ORAL_TABLET | Freq: Once | ORAL | Status: AC
  Administered 2020-10-04: 10 mg via ORAL
  Filled 2020-10-04: qty 2

## 2020-10-04 MED ORDER — ENOXAPARIN SODIUM 150 MG/ML IJ SOSY
PREFILLED_SYRINGE | INTRAMUSCULAR | 0 refills | Status: DC
  Filled 2020-10-04: qty 7, 4d supply, fill #0

## 2020-10-04 MED ORDER — WARFARIN SODIUM 5 MG PO TABS: 10.0000 mg | ORAL_TABLET | Freq: Every day | ORAL | 0 refills | Status: DC

## 2020-10-04 MED ORDER — WARFARIN SODIUM 10 MG PO TABS: 10.0000 mg | ORAL_TABLET | Freq: Every day | ORAL | 0 refills | Status: DC

## 2020-10-04 MED ORDER — FUROSEMIDE 20 MG PO TABS: 20.0000 mg | ORAL_TABLET | Freq: Every day | ORAL | 0 refills | Status: DC

## 2020-10-04 MED ORDER — ENOXAPARIN (LOVENOX) PATIENT EDUCATION KIT
PACK | Freq: Once | Status: AC
  Filled 2020-10-04: qty 1

## 2020-10-04 MED ORDER — CARVEDILOL 6.25 MG PO TABS: 6.2500 mg | ORAL_TABLET | Freq: Two times a day (BID) | ORAL | Status: DC

## 2020-10-04 MED ORDER — INSULIN GLARGINE 100 UNIT/ML SOLOSTAR PEN: 20.0000 [IU] | PEN_INJECTOR | Freq: Every day | SUBCUTANEOUS | 0 refills | Status: AC

## 2020-10-04 MED ORDER — CARVEDILOL 6.25 MG PO TABS: 6.2500 mg | ORAL_TABLET | Freq: Two times a day (BID) | ORAL | 0 refills | Status: AC

## 2020-10-04 MED ORDER — CARVEDILOL 3.125 MG PO TABS
3.1250 mg | ORAL_TABLET | Freq: Once | ORAL | Status: AC
  Administered 2020-10-04: 3.125 mg via ORAL
  Filled 2020-10-04: qty 1

## 2020-10-04 MED ORDER — COUMADIN BOOK
Freq: Once | Status: AC
  Filled 2020-10-04: qty 1

## 2020-10-04 MED ORDER — CARVEDILOL 3.125 MG PO TABS
3.1250 mg | ORAL_TABLET | Freq: Two times a day (BID) | ORAL | Status: DC
  Administered 2020-10-04: 3.125 mg via ORAL
  Filled 2020-10-04: qty 1

## 2020-10-04 MED ORDER — SACUBITRIL-VALSARTAN 24-26 MG PO TABS: 1.0000 | ORAL_TABLET | Freq: Two times a day (BID) | ORAL | 0 refills | Status: AC

## 2020-10-04 MED ORDER — WARFARIN SODIUM 5 MG PO TABS
10.0000 mg | ORAL_TABLET | Freq: Every day | ORAL | 0 refills | Status: DC
  Filled 2020-10-04: qty 6, 3d supply, fill #0

## 2020-10-04 MED ORDER — SPIRONOLACTONE 25 MG PO TABS: 12.5000 mg | ORAL_TABLET | Freq: Every day | ORAL | 0 refills | Status: AC

## 2020-10-04 MED ORDER — PREDNISONE 10 MG PO TABS: 10.0000 mg | ORAL_TABLET | Freq: Every day | ORAL | 0 refills | Status: AC

## 2020-10-04 MED ORDER — DAPAGLIFLOZIN PROPANEDIOL 10 MG PO TABS: 10.0000 mg | ORAL_TABLET | Freq: Every day | ORAL | 0 refills | Status: DC

## 2020-10-04 MED ORDER — ENOXAPARIN SODIUM 150 MG/ML IJ SOSY: PREFILLED_SYRINGE | INTRAMUSCULAR | 0 refills | Status: DC

## 2020-10-04 MED ORDER — INSULIN PEN NEEDLE 32G X 4 MM MISC: 1.0000 | Freq: Three times a day (TID) | 1 refills | Status: AC

## 2020-10-04 NOTE — Progress Notes (Addendum)
Inpatient Diabetes Program Recommendations  AACE/ADA: New Consensus Statement on Inpatient Glycemic Control (2015)  Target Ranges:  Prepandial:   less than 140 mg/dL      Peak postprandial:   less than 180 mg/dL (1-2 hours)      Critically ill patients:  140 - 180 mg/dL   Lab Results  Component Value Date   GLUCAP 207 (H) 10/04/2020    Review of Glycemic Control  Diabetes history: DM 2 Outpatient Diabetes medications: Ozempic 1 mg Qmondays Current orders for Inpatient glycemic control:  Lantus 20 units Daily Novolog 0-15 units 5x/day Farxiga 10 mg Daily  Inpatient Diabetes Program Recommendations:    A1c results in approx 3 days  -  Increase Lantus to 25 units -  Add Novolog 4 units tid meal coverage  Will see pt. Pt on po prednisone 40 mg Daily until 6/9.  Spoke with pt at bedside regarding A1c and glucose control at home. Pt checks glucose once a week. At baseline without gout flare ups and being on prednisone pt reports an A1c close to goal of 7%. Discussed plan for d/c with pt. Pt agreeable with once daily Lantus injection (similar operation to Ozempic pen), and checking glucose twice a day. Pt has his PCP in [place] and will tele-health visit with him for titration of insulin with steroid dose.   Pt reports already taking Jardiance at home. Pt to continue the Jardiance and Ozempic for home and add Lantus dosing.  Thanks,  Christena Deem RN, MSN, BC-ADM Inpatient Diabetes Coordinator Team Pager [phone] (8a-5p)

## 2020-10-04 NOTE — Discharge Summary (Signed)
Physician Discharge Summary  Marcus Atkins PIR:[id number] DOB: 1990-06-19 DOA: 10/01/2020  PCP: Oneita Hurt, No  Admit date: 10/01/2020 Discharge date: 10/04/2020  Admitted From: Home Disposition:  Home  Discharge Condition:Stable CODE STATUS:FULL Diet recommendation: Heart Healthy  Brief/Interim Summary:  Patient is a 30 year old male with history of myocarditis, chronic systolic congestive heart failure, type 2 diabetes, COVID in 2020, gout, history of DVT on Xarelto, morbidly obese who presented here to the emergency room with complaints of cough, shortness of breath worsening with exertion, bilateral lower extremity edema.  He also reported left elbow pain from gout exacerbation.  On presentation he had mild grade temperature, tachycardic, tachypneic, not hypoxic.  Troponins were mildly elevated.  EKG showed left ventricular hypertrophy.  Venous Doppler was negative for DVT bilaterally.  CT angiogram showed bilateral groundglass opacity consistent with fluid overload versus atypical pneumonia.  COVID screen test, flu test negative.  Echo done here showed moderate size apical thrombus, left ventricle ejection fraction of 25 to 30%. Cardiology consulted.  He has been started on Lovenox and Coumadin with plan for continuing Coumadin as an outpatient for anticoagulation.  Patient is adamant about going home today, does not want to stay anymore.  He will follow-up with Coumadin clinic on 6/8 and cardiology on 6/10.  Hemodynamically stable for discharge home today.  Following problems were addressed during his hospitalization:  Apical thrombus: Echo showed 2.4 X1 cm thrombus.  Cardiology were following.  Currently on lovenox and warfarin bridge.  Plan is to put him on warfarin on discharge.  He was previously taking anticoagulation but not now. Patient likes to be discharged today.  He was discharged with 3 days course of Coumadin and Lovenox.  He will follow-up with Coumadin clinic in 6/8 and cardiology on 6/10.   He should get future prescriptions of Coumadin from cardiology clinic after the follow-up appointment.  Acute on chronic combined systolic/diastolic congestive heart failure: History of myocarditis.  Echo done yesterday actually showed 25 to 30%, severely decreased left ventricular function, global hypokinesis, dilation of left ventricular internal cavity.  Started on Entresto, lasix, carvedilol.  Also started on Farxiga,spironolactone. He was following with advanced heart failure at [facility] [place] but recently moved to [facility].  Cardiac MRI on 3/70/20 showed severe left ventricular systolic dysfunction with ejection fraction of 14%, a small left ventricular thrombus.  He was discharged on LifeVest but he was not started on anticoagulation for his left ventricular thrombus.  Currently he is not following with cardiology.  He needs to follow-up with advanced heart failure clinic on discharge.  Cardiology recommending Zio patch on discharge.  He has an appointment in 6/10.  Abnormal chest imaging: CT angiogram showed bibasilar opacity concerning for atypical pneumonia or viral pneumonia.  Procalcitonin negative.  He has some productive cough, afebrile.  Chest findings are most likely secondary to fluid/congestive heart failure.  Antibiotics discontinued.  Respiratory viral panel negative.He had mild leucocytosis.  Acute on chronic gout flare: Has erythema, tenderness of left elbow.  Takes allopurinol at home.    Taking prednisone 10 mg daily at home.  We will continue tapering dose of prednisone and completely stop.  Hypokalemia: Supplemented with potassium and corrected  Diabetes type 2: Hemoglobin A1c of 9.3.  Takes Ozempic, Farxiga at home.  Added Lantus 20 units daily.  Worsening glycemic control most likely secondary to prednisone.  Diabetic oriented was following.   History of remote DVT: Taking Xarelto at home.  Anticoagulation changed to warfarin.  Chronic bilateral lower  extremity pain: Continue supportive care, pain management  Morbid obesity: BMI of 52.  Bradycardia: Observed during night.  Likely associated with OSA.  He needs sleep study as an outpatient.  Asymptomatic      Discharge Diagnoses:  Principal Problem:   Elevated troponin Active Problems:   Gout attack   Viral pneumonia   Hypokalemia   DM (diabetes mellitus), type 2 (HCC)   Obesity, Class III, BMI 40-49.9 (morbid obesity) (HCC)   Left ventricular apical thrombus    Discharge Instructions  Discharge Instructions    Diet - low sodium heart healthy   Complete by: As directed    Discharge instructions   Complete by: As directed    1)Take prescribed medications as instructed 2)Follow up with your PCP as an outpatient.  Monitor blood glucose and blood sugars at home 3)You have an appointment at Coumadin clinic on 6/8.  You have an appointment with cardiology on 6/10 4) you should continue to monitor INR, continue follow-up with Coumadin clinic.  We have prescribed Lovenox and Coumadin for 3 days only.  You should continue taking Coumadin after follow-up with cardiology on 6/10   Increase activity slowly   Complete by: As directed      Allergies as of 10/04/2020      Reactions   Phenergan [promethazine]    Tramadol Itching      Medication List    STOP taking these medications   predniSONE 10 MG (21) Tbpk tablet Commonly known as: STERAPRED UNI-PAK 21 TAB Replaced by: predniSONE 10 MG tablet   rivaroxaban 20 MG Tabs tablet Commonly known as: XARELTO     TAKE these medications   allopurinol 300 MG tablet Commonly known as: ZYLOPRIM Take 300 mg by mouth 2 (two) times daily.   atorvastatin 80 MG tablet Commonly known as: LIPITOR Take 1 tablet by mouth daily.   carvedilol 6.25 MG tablet Commonly known as: COREG Take 1 tablet (6.25 mg total) by mouth 2 (two) times daily with a meal. What changed:   medication strength  how much to take  when to take this    dapagliflozin propanediol 10 MG Tabs tablet Commonly known as: FARXIGA Take 1 tablet (10 mg total) by mouth daily. Start taking on: October 05, 2020   enoxaparin 150 MG/ML injection Commonly known as: LOVENOX Inject one syringe (150 mg) into the skin as directed twice daily (through 10/07/20) Notes to patient: Follow up with Coumadin clinic on [facility] this Wednesday, 6/8 for further instructions.   fluticasone 50 MCG/ACT nasal spray Commonly known as: FLONASE Place 1 spray into both nostrils daily as needed.   furosemide 20 MG tablet Commonly known as: LASIX Take 1 tablet (20 mg total) by mouth daily. Start taking on: October 05, 2020   HYDROcodone-acetaminophen 5-325 MG tablet Commonly known as: NORCO/VICODIN Take 1 tablet by mouth every 6 (six) hours as needed for severe pain.   insulin glargine 100 UNIT/ML Solostar Pen Commonly known as: LANTUS Inject 20 Units into the skin daily.   Insulin Pen Needle 32G X 4 MM Misc 1 each by Does not apply route 3 (three) times daily.   loratadine 10 MG tablet Commonly known as: CLARITIN Take 10 mg by mouth daily as needed for allergies.   naproxen 500 MG tablet Commonly known as: NAPROSYN Take 500 mg by mouth 2 (two) times daily as needed for moderate pain.   Ozempic (1 MG/DOSE) 4 MG/3ML Sopn Generic drug: Semaglutide (1 MG/DOSE) Inject 1 mg into the skin  once a week. Mondays   pantoprazole 40 MG tablet Commonly known as: PROTONIX Take 40 mg by mouth daily as needed (heartburn).   predniSONE 10 MG tablet Commonly known as: DELTASONE Take 1 tablet (10 mg total) by mouth daily. Take 4 pills tomorrow then 3 pills daily for 2 days then 2 pills daily for 2 days then 1 pill daily for 2 days then stop Start taking on: October 05, 2020 Replaces: predniSONE 10 MG (21) Tbpk tablet   sacubitril-valsartan 24-26 MG Commonly known as: ENTRESTO Take 1 tablet by mouth 2 (two) times daily.   spironolactone 25 MG tablet Commonly known as:  ALDACTONE Take 0.5 tablets (12.5 mg total) by mouth daily. Start taking on: October 05, 2020   warfarin 10 MG tablet Commonly known as: Coumadin Take 1 tablet (10 mg total) by mouth daily. Start taking on: October 05, 2020       Follow-up Information    [facility] Follow up on 10/06/2020.   Specialty: Cardiology Why: 2:30 pm for INR check Contact information: [address] [place] [address] [phone]       [facility] Follow up on 10/08/2020.   Specialty: Cardiology Why: 3:00 pm Contact information: [address] [id number] [place] [address] [phone]       [facility] Follow up on 11/05/2020.   Specialty: Internal Medicine Why: Second Friday of July at 9:30 for your hospital follow up appointment Contact information: [address] [place] [address] [phone]             Allergies  Allergen Reactions  . Phenergan [Promethazine]   . Tramadol Itching    Consultations:  Cardiology   Procedures/Studies: DG Chest 2 View  Result Date: 10/01/2020 CLINICAL DATA:  Fatigue. EXAM: CHEST - 2 VIEW COMPARISON:  None. FINDINGS: Decreased lung volumes are seen which may be secondary to suboptimal patient inspiration. Increased bronchovascular lung markings are seen within the bilateral lung bases. There is no evidence of a pleural effusion or pneumothorax. The cardiac silhouette is mildly enlarged. The visualized skeletal structures are unremarkable. IMPRESSION: Low lung volumes with bibasilar bronchovascular crowding. Correlation with follow-up chest plain film with improved patient inspiration is recommended if clinical symptoms persist. Electronically Signed   By: Aram Candela M.D.   On: 10/01/2020 16:11   CT Angio Chest PE W and/or Wo Contrast  Result Date: 10/01/2020 CLINICAL DATA:  PE suspected, shortness of breath,  hemoptysis EXAM: CT ANGIOGRAPHY CHEST WITH CONTRAST TECHNIQUE: Multidetector CT imaging of the chest was performed using the standard protocol during bolus administration of intravenous contrast. Multiplanar CT image reconstructions and MIPs were obtained to evaluate the vascular anatomy. CONTRAST:  80mL OMNIPAQUE IOHEXOL 350 MG/ML SOLN COMPARISON:  None. FINDINGS: Cardiovascular: Satisfactory opacification of the pulmonary arteries to the segmental level. No evidence of pulmonary embolism. Cardiomegaly. No pericardial effusion. Mediastinum/Nodes: No enlarged mediastinal, hilar, or axillary lymph nodes. Thyroid gland, trachea, and esophagus demonstrate no significant findings. Lungs/Pleura: Extensive bilateral heterogeneous and ground-glass airspace opacity throughout the lungs, with a bibasilar predominance. No pleural effusion or pneumothorax. Upper Abdomen: No acute abnormality. Musculoskeletal: No chest wall abnormality. No acute or significant osseous findings. Review of the MIP images confirms the above findings. IMPRESSION: 1. Negative examination for pulmonary embolism. 2. Extensive bilateral heterogeneous and ground-glass airspace opacity throughout the lungs, with a bibasilar predominance. Findings are consistent with multifocal infection, including COVID-19 if  clinically suspected. 3. Cardiomegaly. Electronically Signed   By: Lauralyn Primes M.D.   On: 10/01/2020 19:54   ECHOCARDIOGRAM COMPLETE  Result Date: 10/02/2020    ECHOCARDIOGRAM REPORT   Patient Name:   Marcus Atkins Date of Exam: 10/02/2020 Medical Rec #:  [id number]   Height:       69.0 in Accession #:    [id number]  Weight:       354.3 lb Date of Birth:  1990/09/06   BSA:          2.634 m Patient Age:    30 years    BP:           122/95 mmHg Patient Gender: M           HR:           106 bpm. Exam Location:  Inpatient Procedure: 2D Echo, Cardiac Doppler, Color Doppler and Intracardiac            Opacification Agent Indications:    Elevated troponin   History:        Patient has no prior history of Echocardiogram examinations.                 Risk Factors:Morbid obesity. Coughing. Lower extremity edema.                 [place]. Was previsouly                 seen by cardiology in [place]. H/O DVT.  Sonographer:    Roosvelt Maser RDCS Referring Phys: [id number] CHING T TU  Sonographer Comments: Patient is morbidly obese. IMPRESSIONS  1. There is a protruding, but relatively fixed moderate size apical thrombus (2.4 x 1 cm). Left ventricular ejection fraction, by estimation, is 25 to 30%. The left ventricle has severely decreased function. The left ventricle demonstrates global hypokinesis. The left ventricular internal cavity size was severely dilated. Left ventricular diastolic parameters are consistent with Grade III diastolic dysfunction (restrictive). Elevated left atrial pressure.  2. Right ventricular systolic function is normal. The right ventricular size is normal. Tricuspid regurgitation signal is inadequate for assessing PA pressure.  3. Left atrial size was moderately dilated.  4. The mitral valve is normal in structure. Mild to moderate mitral valve regurgitation.  5. The aortic valve is tricuspid. Aortic valve regurgitation is not visualized. No aortic stenosis is present.  6. The inferior vena cava is normal in size with greater than 50% respiratory variability, suggesting right atrial pressure of 3 mmHg. Comparison(s): No prior Echocardiogram. FINDINGS  Left Ventricle: There is a protruding, but relatively fixed moderate size apical thrombus (2.4 x 1 cm). Left ventricular ejection fraction, by estimation, is 25 to 30%. The left ventricle has severely decreased function. The left ventricle demonstrates global hypokinesis. Definity contrast agent was given IV to delineate the left ventricular endocardial borders. The left ventricular internal cavity size was severely dilated. There is no left ventricular hypertrophy. Left  ventricular diastolic parameters are consistent with Grade III diastolic dysfunction (restrictive). Elevated left atrial pressure. Right Ventricle: The right ventricular size is normal. No increase in right ventricular wall thickness. Right ventricular systolic function is normal. Tricuspid regurgitation signal is inadequate for assessing PA pressure. Left Atrium: Left atrial size was moderately dilated. Right Atrium: Right atrial size was normal in size. Pericardium: There is no evidence of pericardial effusion. Mitral Valve: The mitral valve is normal in structure. Mild to moderate mitral valve regurgitation,  with centrally-directed jet. Tricuspid Valve: The tricuspid valve is normal in structure. Tricuspid valve regurgitation is not demonstrated. Aortic Valve: The aortic valve is tricuspid. Aortic valve regurgitation is not visualized. No aortic stenosis is present. Pulmonic Valve: The pulmonic valve was grossly normal. Pulmonic valve regurgitation is not visualized. Aorta: The aortic root is normal in size and structure. Venous: The inferior vena cava is normal in size with greater than 50% respiratory variability, suggesting right atrial pressure of 3 mmHg. IAS/Shunts: No atrial level shunt detected by color flow Doppler.  LEFT VENTRICLE PLAX 2D LVIDd:         7.00 cm LVIDs:         6.10 cm LV PW:         1.00 cm LV IVS:        1.00 cm LVOT diam:     2.20 cm LV SV:         29 LV SV Index:   11 LVOT Area:     3.80 cm  RIGHT VENTRICLE             IVC RV Basal diam:  5.60 cm     IVC diam: 2.00 cm RV Mid diam:    3.70 cm RV S prime:     13.50 cm/s TAPSE (M-mode): 2.7 cm LEFT ATRIUM             Index       RIGHT ATRIUM           Index LA diam:        4.80 cm 1.82 cm/m  RA Area:     25.60 cm LA Vol (A2C):   84.9 ml 32.23 ml/m RA Volume:   85.30 ml  32.38 ml/m LA Vol (A4C):   79.5 ml 30.18 ml/m LA Biplane Vol: 88.4 ml 33.56 ml/m  AORTIC VALVE LVOT Vmax:   47.70 cm/s LVOT Vmean:  33.800 cm/s LVOT VTI:    0.076 m   AORTA Ao Root diam: 2.90 cm MITRAL VALVE MV Area (PHT): 7.44 cm    SHUNTS MV Decel Time: 102 msec    Systemic VTI:  0.08 m MV E velocity: 89.50 cm/s  Systemic Diam: 2.20 cm MV A velocity: 60.80 cm/s MV E/A ratio:  1.47 Mihai Croitoru MD Electronically signed by Thurmon Fair MD Signature Date/Time: 10/02/2020/11:45:14 AM    Final    VAS [place] LOWER EXTREMITY VENOUS (DVT) (ONLY [facility] & [facility] 7a-7p)  Result Date: 10/02/2020  Lower Venous DVT Study Patient Name:  Marcus Atkins  Date of Exam:   10/01/2020 Medical Rec #: [id number]    Accession #:    [id number] Date of Birth: 03/16/1991    Patient Gender: M Patient Age:   30Y Exam Location:  [facility] Procedure:      VAS [place] LOWER EXTREMITY VENOUS (DVT) Referring Phys: [id number] BOWIE TRAN --------------------------------------------------------------------------------  Indications: Swelling, and SOB.  Comparison Study: No prior. Performing Technologist: Marilynne Halsted RDMS, RVT  Examination Guidelines: A complete evaluation includes B-mode imaging, spectral Doppler, color Doppler, and power Doppler as needed of all accessible portions of each vessel. Bilateral testing is considered an integral part of a complete examination. Limited examinations for reoccurring indications may be performed as noted. The reflux portion of the exam is performed with the patient in reverse Trendelenburg.  +---------+---------------+---------+-----------+----------+--------------+ RIGHT    CompressibilityPhasicitySpontaneityPropertiesThrombus Aging +---------+---------------+---------+-----------+----------+--------------+ CFV      Full           Yes  Yes                                 +---------+---------------+---------+-----------+----------+--------------+ SFJ      Full                                                        +---------+---------------+---------+-----------+----------+--------------+ FV Prox  Full                                                         +---------+---------------+---------+-----------+----------+--------------+ FV Mid   Full                                                        +---------+---------------+---------+-----------+----------+--------------+ FV DistalFull                                                        +---------+---------------+---------+-----------+----------+--------------+ PFV      Full                                                        +---------+---------------+---------+-----------+----------+--------------+ POP      Full           Yes      Yes                                 +---------+---------------+---------+-----------+----------+--------------+ PTV      Full                                                        +---------+---------------+---------+-----------+----------+--------------+ PERO     Full                                                        +---------+---------------+---------+-----------+----------+--------------+   +----+---------------+---------+-----------+----------+--------------+ LEFTCompressibilityPhasicitySpontaneityPropertiesThrombus Aging +----+---------------+---------+-----------+----------+--------------+ CFV Full           Yes      Yes                                 +----+---------------+---------+-----------+----------+--------------+ SFJ Full                                                        +----+---------------+---------+-----------+----------+--------------+  Summary: RIGHT: - There is no evidence of deep vein thrombosis in the lower extremity.  - No cystic structure found in the popliteal fossa.  LEFT: - No evidence of common femoral vein obstruction.  *See table(s) above for measurements and observations. Electronically signed by Fabienne Bruns MD on 10/02/2020 at 12:01:08 PM.    Final       Subjective:  Patient seen and examined the bedside this morning.  Medically stable for discharge  today.  Discharge Exam: Vitals:   10/03/20 2044 10/04/20 0450  BP: (!) 133/95 91/66  Pulse: 98 95  Resp: 17 14  Temp: 98.4 F (36.9 C) (!) 97.5 F (36.4 C)  SpO2: 97% 97%   Vitals:   10/03/20 1358 10/03/20 2044 10/04/20 0450 10/04/20 0452  BP: 97/62 (!) 133/95 91/66   Pulse: 98 98 95   Resp: 16 17 14    Temp: (!) 97.4 F (36.3 C) 98.4 F (36.9 C) (!) 97.5 F (36.4 C)   TempSrc: Oral Oral Oral   SpO2: 96% 97% 97%   Weight:    (!) 158 kg  Height:        General: Pt is alert, awake, not in acute distress, morbidly obese Cardiovascular: RRR, S1/S2 +, no rubs, no gallops Respiratory: CTA bilaterally, no wheezing, no rhonchi Abdominal: Soft, NT, ND, bowel sounds + Extremities: no edema, no cyanosis    The results of significant diagnostics from this hospitalization (including imaging, microbiology, ancillary and laboratory) are listed below for reference.     Microbiology: Recent Results (from the past 240 hour(s))  Resp Panel by RT-PCR (Flu A&B, Covid) Nasopharyngeal Swab     Status: None   Collection Time: 10/01/20  4:35 PM   Specimen: Nasopharyngeal Swab; Nasopharyngeal(NP) swabs in vial transport medium  Result Value Ref Range Status   SARS Coronavirus 2 by RT PCR NEGATIVE NEGATIVE Final    Comment: (NOTE) SARS-CoV-2 target nucleic acids are NOT DETECTED.  The SARS-CoV-2 RNA is generally detectable in upper respiratory specimens during the acute phase of infection. The lowest concentration of SARS-CoV-2 viral copies this assay can detect is 138 copies/mL. A negative result does not preclude SARS-Cov-2 infection and should not be used as the sole basis for treatment or other patient management decisions. A negative result may occur with  improper specimen collection/handling, submission of specimen other than nasopharyngeal swab, presence of viral mutation(s) within the areas targeted by this assay, and inadequate number of viral copies(<138 copies/mL). A  negative result must be combined with clinical observations, patient history, and epidemiological information. The expected result is Negative.  Fact Sheet for Patients:  BloggerCourse.com  Fact Sheet for Healthcare Providers:  SeriousBroker.it  This test is no t yet approved or cleared by the [place] FDA and  has been authorized for detection and/or diagnosis of SARS-CoV-2 by FDA under an Emergency Use Authorization (EUA). This EUA will remain  in effect (meaning this test can be used) for the duration of the COVID-19 declaration under Section 564(b)(1) of the Act, 21 U.S.C.section 360bbb-3(b)(1), unless the authorization is terminated  or revoked sooner.       Influenza A by PCR NEGATIVE NEGATIVE Final   Influenza B by PCR NEGATIVE NEGATIVE Final    Comment: (NOTE) The Xpert Xpress SARS-CoV-2/FLU/RSV plus assay is intended as an aid in the diagnosis of influenza from Nasopharyngeal swab specimens and should not be used as a sole basis for treatment. Nasal washings and aspirates are unacceptable for Xpert Xpress SARS-CoV-2/FLU/RSV testing.  Fact Sheet for Patients: BloggerCourse.comhttps://www.fda.gov/media/152166/download  Fact Sheet for Healthcare Providers: SeriousBroker.ithttps://www.fda.gov/media/152162/download  This test is not yet approved or cleared by the [place] FDA and has been authorized for detection and/or diagnosis of SARS-CoV-2 by FDA under an Emergency Use Authorization (EUA). This EUA will remain in effect (meaning this test can be used) for the duration of the COVID-19 declaration under Section 564(b)(1) of the Act, 21 U.S.C. section 360bbb-3(b)(1), unless the authorization is terminated or revoked.  Performed at [facility], [address] [place], [place] [id number]   Respiratory (~20 pathogens) panel by PCR     Status: None   Collection Time: 10/01/20  9:12 PM   Specimen: Nasopharyngeal Swab;  Respiratory  Result Value Ref Range Status   Adenovirus NOT DETECTED NOT DETECTED Final   Coronavirus 229E NOT DETECTED NOT DETECTED Final    Comment: (NOTE) The Coronavirus on the Respiratory Panel, DOES NOT test for the novel  Coronavirus (2019 nCoV)    Coronavirus HKU1 NOT DETECTED NOT DETECTED Final   Coronavirus NL63 NOT DETECTED NOT DETECTED Final   Coronavirus OC43 NOT DETECTED NOT DETECTED Final   Metapneumovirus NOT DETECTED NOT DETECTED Final   Rhinovirus / Enterovirus NOT DETECTED NOT DETECTED Final   Influenza A NOT DETECTED NOT DETECTED Final   Influenza B NOT DETECTED NOT DETECTED Final   Parainfluenza Virus 1 NOT DETECTED NOT DETECTED Final   Parainfluenza Virus 2 NOT DETECTED NOT DETECTED Final   Parainfluenza Virus 3 NOT DETECTED NOT DETECTED Final   Parainfluenza Virus 4 NOT DETECTED NOT DETECTED Final   Respiratory Syncytial Virus NOT DETECTED NOT DETECTED Final   Bordetella pertussis NOT DETECTED NOT DETECTED Final   Bordetella Parapertussis NOT DETECTED NOT DETECTED Final   Chlamydophila pneumoniae NOT DETECTED NOT DETECTED Final   Mycoplasma pneumoniae NOT DETECTED NOT DETECTED Final    Comment: Performed at [facility], [address] [place], [place] [address]     Labs: BNP (last 3 results) Recent Labs    10/01/20 1515  BNP 127.4*   Basic Metabolic Panel: Recent Labs  Lab 10/01/20 1515 10/02/20 0607 10/03/20 0621 10/04/20 0543  NA 143 142 141 142  K 3.1* 4.0 3.6 4.1  CL 108 105 106 105  CO2 27 26 27 28   GLUCOSE 153* 275* 202* 202*  BUN 11 13 15 20   CREATININE 1.19 1.04 1.03 1.18  CALCIUM 9.1 9.1 9.1 9.8  MG  --   --  1.9  --    Liver Function Tests: No results for input(s): AST, ALT, ALKPHOS, BILITOT, PROT, ALBUMIN in the last 168 hours. No results for input(s): LIPASE, AMYLASE in the last 168 hours. No results for input(s): AMMONIA in the last 168 hours. CBC: Recent Labs  Lab 10/01/20 1635 10/02/20 0607 10/03/20 0621  10/04/20 0543  WBC 11.3* 9.4 14.4* 12.9*  NEUTROABS 7.3  --   --   --   HGB 13.6 13.4 13.1 13.9  HCT 43.8 42.2 41.0 45.3  MCV 86.2 85.1 84.4 85.8  PLT 294 285 286 346   Cardiac Enzymes: No results for input(s): CKTOTAL, CKMB, CKMBINDEX, TROPONINI in the last 168 hours. BNP: Invalid input(s): POCBNP CBG: Recent Labs  Lab 10/03/20 1653 10/03/20 2112 10/04/20 0158 10/04/20 0732 10/04/20 1115  GLUCAP 295* 236* 225* 160* 207*   D-Dimer Recent Labs    10/01/20 1515  DDIMER 0.64*   Hgb A1c Recent Labs    10/02/20 0121  HGBA1C 9.3*   Lipid  Profile No results for input(s): CHOL, HDL, LDLCALC, TRIG, CHOLHDL, LDLDIRECT in the last 72 hours. Thyroid function studies No results for input(s): TSH, T4TOTAL, T3FREE, THYROIDAB in the last 72 hours.  Invalid input(s): FREET3 Anemia work up No results for input(s): VITAMINB12, FOLATE, FERRITIN, TIBC, IRON, RETICCTPCT in the last 72 hours. Urinalysis No results found for: COLORURINE, APPEARANCEUR, LABSPEC, PHURINE, GLUCOSEU, HGBUR, BILIRUBINUR, KETONESUR, PROTEINUR, UROBILINOGEN, NITRITE, LEUKOCYTESUR Sepsis Labs Invalid input(s): PROCALCITONIN,  WBC,  LACTICIDVEN Microbiology Recent Results (from the past 240 hour(s))  Resp Panel by RT-PCR (Flu A&B, Covid) Nasopharyngeal Swab     Status: None   Collection Time: 10/01/20  4:35 PM   Specimen: Nasopharyngeal Swab; Nasopharyngeal(NP) swabs in vial transport medium  Result Value Ref Range Status   SARS Coronavirus 2 by RT PCR NEGATIVE NEGATIVE Final    Comment: (NOTE) SARS-CoV-2 target nucleic acids are NOT DETECTED.  The SARS-CoV-2 RNA is generally detectable in upper respiratory specimens during the acute phase of infection. The lowest concentration of SARS-CoV-2 viral copies this assay can detect is 138 copies/mL. A negative result does not preclude SARS-Cov-2 infection and should not be used as the sole basis for treatment or other patient management decisions. A negative  result may occur with  improper specimen collection/handling, submission of specimen other than nasopharyngeal swab, presence of viral mutation(s) within the areas targeted by this assay, and inadequate number of viral copies(<138 copies/mL). A negative result must be combined with clinical observations, patient history, and epidemiological information. The expected result is Negative.  Fact Sheet for Patients:  BloggerCourse.com  Fact Sheet for Healthcare Providers:  SeriousBroker.it  This test is no t yet approved or cleared by the [place] FDA and  has been authorized for detection and/or diagnosis of SARS-CoV-2 by FDA under an Emergency Use Authorization (EUA). This EUA will remain  in effect (meaning this test can be used) for the duration of the COVID-19 declaration under Section 564(b)(1) of the Act, 21 U.S.C.section 360bbb-3(b)(1), unless the authorization is terminated  or revoked sooner.       Influenza A by PCR NEGATIVE NEGATIVE Final   Influenza B by PCR NEGATIVE NEGATIVE Final    Comment: (NOTE) The Xpert Xpress SARS-CoV-2/FLU/RSV plus assay is intended as an aid in the diagnosis of influenza from Nasopharyngeal swab specimens and should not be used as a sole basis for treatment. Nasal washings and aspirates are unacceptable for Xpert Xpress SARS-CoV-2/FLU/RSV testing.  Fact Sheet for Patients: BloggerCourse.com  Fact Sheet for Healthcare Providers: SeriousBroker.it  This test is not yet approved or cleared by the [place] FDA and has been authorized for detection and/or diagnosis of SARS-CoV-2 by FDA under an Emergency Use Authorization (EUA). This EUA will remain in effect (meaning this test can be used) for the duration of the COVID-19 declaration under Section 564(b)(1) of the Act, 21 U.S.C. section 360bbb-3(b)(1), unless the authorization is  terminated or revoked.  Performed at [facility], [address] [place], [place] [address]   Respiratory (~20 pathogens) panel by PCR     Status: None   Collection Time: 10/01/20  9:12 PM   Specimen: Nasopharyngeal Swab; Respiratory  Result Value Ref Range Status   Adenovirus NOT DETECTED NOT DETECTED Final   Coronavirus 229E NOT DETECTED NOT DETECTED Final    Comment: (NOTE) The Coronavirus on the Respiratory Panel, DOES NOT test for the novel  Coronavirus (2019 nCoV)    Coronavirus HKU1 NOT DETECTED NOT DETECTED Final   Coronavirus NL63  NOT DETECTED NOT DETECTED Final   Coronavirus OC43 NOT DETECTED NOT DETECTED Final   Metapneumovirus NOT DETECTED NOT DETECTED Final   Rhinovirus / Enterovirus NOT DETECTED NOT DETECTED Final   Influenza A NOT DETECTED NOT DETECTED Final   Influenza B NOT DETECTED NOT DETECTED Final   Parainfluenza Virus 1 NOT DETECTED NOT DETECTED Final   Parainfluenza Virus 2 NOT DETECTED NOT DETECTED Final   Parainfluenza Virus 3 NOT DETECTED NOT DETECTED Final   Parainfluenza Virus 4 NOT DETECTED NOT DETECTED Final   Respiratory Syncytial Virus NOT DETECTED NOT DETECTED Final   Bordetella pertussis NOT DETECTED NOT DETECTED Final   Bordetella Parapertussis NOT DETECTED NOT DETECTED Final   Chlamydophila pneumoniae NOT DETECTED NOT DETECTED Final   Mycoplasma pneumoniae NOT DETECTED NOT DETECTED Final    Comment: Performed at [facility], [address], [place], [place] [address]    Please note: You were cared for by a hospitalist during your hospital stay. Once you are discharged, your primary care physician will handle any further medical issues. Please note that NO REFILLS for any discharge medications will be authorized once you are discharged, as it is imperative that you return to your primary care physician (or establish a relationship with a primary care physician if you do not have one) for your post hospital discharge  needs so that they can reassess your need for medications and monitor your lab values.    Time coordinating discharge: 40 minutes  SIGNED:   Burnadette Pop, MD  [facility] 10/04/2020, 1:14 PM Pager [phone]  If 7PM-7AM, please contact night-coverage [email or web address] Password [email or web address]

## 2020-10-04 NOTE — Plan of Care (Signed)

## 2020-10-04 NOTE — Progress Notes (Signed)
Discharge instructions given with stated understanding.  Lovenox teaching reinforced including hand hygiene, rotation of injection sites and proper disposal of syringes. Patient also educated on bleeding precautions.  Patient states understanding of all education

## 2020-10-04 NOTE — Progress Notes (Addendum)
Progress Note  Patient Name: Marcus Atkins Date of Encounter: 10/04/2020  [facility] Cardiologist: Little Ishikawa, MD /Advanced CHF   Subjective   Pt feels well today and wishes to discharge. He lives in [place]. Follow up has been arranged.  Inpatient Medications    Scheduled Meds:  allopurinol  300 mg Oral BID   atorvastatin  80 mg Oral Daily   carvedilol  3.125 mg Oral BID WC   dapagliflozin propanediol  10 mg Oral Daily   enoxaparin (LOVENOX) injection  160 mg Subcutaneous Q12H   furosemide  20 mg Oral Daily   insulin aspart  0-15 Units Subcutaneous TID PC,HS,0200   insulin glargine  20 Units Subcutaneous Daily   predniSONE  40 mg Oral Q breakfast   sacubitril-valsartan  1 tablet Oral BID   spironolactone  12.5 mg Oral Daily   Warfarin - Pharmacist Dosing Inpatient   Does not apply q1600   Continuous Infusions:   PRN Meds: HYDROcodone-acetaminophen, pantoprazole   Vital Signs    Vitals:   10/03/20 1358 10/03/20 2044 10/04/20 0450 10/04/20 0452  BP: 97/62 (!) 133/95 91/66   Pulse: 98 98 95   Resp: 16 17 14    Temp: (!) 97.4 F (36.3 C) 98.4 F (36.9 C) (!) 97.5 F (36.4 C)   TempSrc: Oral Oral Oral   SpO2: 96% 97% 97%   Weight:    (!) 158 kg  Height:        Intake/Output Summary (Last 24 hours) at 10/04/2020 0848 Last data filed at 10/03/2020 1900 Gross per 24 hour  Intake 615 ml  Output 1000 ml  Net -385 ml   Last 3 Weights 10/04/2020 10/02/2020 10/02/2020  Weight (lbs) 348 lb 5.2 oz 354 lb 4.5 oz 356 lb 4.2 oz  Weight (kg) 158 kg 160.7 kg 161.6 kg      Telemetry    Sinus to ST HR 90-100s - Personally Reviewed  ECG    No new tracings - Personally Reviewed  Physical Exam   GEN: obese male in no acute distress.   Neck: No JVD Cardiac: RRR, no murmurs, rubs, or gallops.  Respiratory: Clear to auscultation bilaterally. GI: Soft, nontender, non-distended  MS: No edema; No deformity. Neuro:  Nonfocal  Psych: Normal affect   Labs    High  Sensitivity Troponin:   Recent Labs  Lab 10/01/20 1635 10/01/20 1841  TROPONINIHS 129* 125*      Chemistry Recent Labs  Lab 10/02/20 0607 10/03/20 0621 10/04/20 0543  NA 142 141 142  K 4.0 3.6 4.1  CL 105 106 105  CO2 26 27 28   GLUCOSE 275* 202* 202*  BUN 13 15 20   CREATININE 1.04 1.03 1.18  CALCIUM 9.1 9.1 9.8  GFRNONAA >60 >60 >60  ANIONGAP 11 8 9      Hematology Recent Labs  Lab 10/02/20 0607 10/03/20 0621 10/04/20 0543  WBC 9.4 14.4* 12.9*  RBC 4.96 4.86 5.28  HGB 13.4 13.1 13.9  HCT 42.2 41.0 45.3  MCV 85.1 84.4 85.8  MCH 27.0 27.0 26.3  MCHC 31.8 32.0 30.7  RDW 17.2* 16.9* 17.1*  PLT 285 286 346    BNP Recent Labs  Lab 10/01/20 1515  BNP 127.4*     DDimer  Recent Labs  Lab 10/01/20 1515  DDIMER 0.64*     Radiology    ECHOCARDIOGRAM COMPLETE  Result Date: 10/02/2020    ECHOCARDIOGRAM REPORT   Patient Name:   Marcus Atkins Date of Exam: 10/02/2020 Medical  Rec #:  [id number]   Height:       69.0 in Accession #:    [id number]  Weight:       354.3 lb Date of Birth:  May 28, 1990   BSA:          2.634 m Patient Age:    30 years    BP:           122/95 mmHg Patient Gender: M           HR:           106 bpm. Exam Location:  Inpatient Procedure: 2D Echo, Cardiac Doppler, Color Doppler and Intracardiac            Opacification Agent Indications:    Elevated troponin  History:        Patient has no prior history of Echocardiogram examinations.                 Risk Factors:Morbid obesity. Coughing. Lower extremity edema.                 [place]. Was previsouly                 seen by cardiology in [place]. H/O DVT.  Sonographer:    Roosvelt Maser RDCS Referring Phys: [id number] CHING T TU  Sonographer Comments: Patient is morbidly obese. IMPRESSIONS  1. There is a protruding, but relatively fixed moderate size apical thrombus (2.4 x 1 cm). Left ventricular ejection fraction, by estimation, is 25 to 30%. The left ventricle has severely  decreased function. The left ventricle demonstrates global hypokinesis. The left ventricular internal cavity size was severely dilated. Left ventricular diastolic parameters are consistent with Grade III diastolic dysfunction (restrictive). Elevated left atrial pressure.  2. Right ventricular systolic function is normal. The right ventricular size is normal. Tricuspid regurgitation signal is inadequate for assessing PA pressure.  3. Left atrial size was moderately dilated.  4. The mitral valve is normal in structure. Mild to moderate mitral valve regurgitation.  5. The aortic valve is tricuspid. Aortic valve regurgitation is not visualized. No aortic stenosis is present.  6. The inferior vena cava is normal in size with greater than 50% respiratory variability, suggesting right atrial pressure of 3 mmHg. Comparison(s): No prior Echocardiogram. FINDINGS  Left Ventricle: There is a protruding, but relatively fixed moderate size apical thrombus (2.4 x 1 cm). Left ventricular ejection fraction, by estimation, is 25 to 30%. The left ventricle has severely decreased function. The left ventricle demonstrates global hypokinesis. Definity contrast agent was given IV to delineate the left ventricular endocardial borders. The left ventricular internal cavity size was severely dilated. There is no left ventricular hypertrophy. Left ventricular diastolic parameters are consistent with Grade III diastolic dysfunction (restrictive). Elevated left atrial pressure. Right Ventricle: The right ventricular size is normal. No increase in right ventricular wall thickness. Right ventricular systolic function is normal. Tricuspid regurgitation signal is inadequate for assessing PA pressure. Left Atrium: Left atrial size was moderately dilated. Right Atrium: Right atrial size was normal in size. Pericardium: There is no evidence of pericardial effusion. Mitral Valve: The mitral valve is normal in structure. Mild to moderate mitral valve  regurgitation, with centrally-directed jet. Tricuspid Valve: The tricuspid valve is normal in structure. Tricuspid valve regurgitation is not demonstrated. Aortic Valve: The aortic valve is tricuspid. Aortic valve regurgitation is not visualized. No aortic stenosis is present. Pulmonic Valve: The pulmonic valve was grossly normal.  Pulmonic valve regurgitation is not visualized. Aorta: The aortic root is normal in size and structure. Venous: The inferior vena cava is normal in size with greater than 50% respiratory variability, suggesting right atrial pressure of 3 mmHg. IAS/Shunts: No atrial level shunt detected by color flow Doppler.  LEFT VENTRICLE PLAX 2D LVIDd:         7.00 cm LVIDs:         6.10 cm LV PW:         1.00 cm LV IVS:        1.00 cm LVOT diam:     2.20 cm LV SV:         29 LV SV Index:   11 LVOT Area:     3.80 cm  RIGHT VENTRICLE             IVC RV Basal diam:  5.60 cm     IVC diam: 2.00 cm RV Mid diam:    3.70 cm RV S prime:     13.50 cm/s TAPSE (M-mode): 2.7 cm LEFT ATRIUM             Index       RIGHT ATRIUM           Index LA diam:        4.80 cm 1.82 cm/m  RA Area:     25.60 cm LA Vol (A2C):   84.9 ml 32.23 ml/m RA Volume:   85.30 ml  32.38 ml/m LA Vol (A4C):   79.5 ml 30.18 ml/m LA Biplane Vol: 88.4 ml 33.56 ml/m  AORTIC VALVE LVOT Vmax:   47.70 cm/s LVOT Vmean:  33.800 cm/s LVOT VTI:    0.076 m  AORTA Ao Root diam: 2.90 cm MITRAL VALVE MV Area (PHT): 7.44 cm    SHUNTS MV Decel Time: 102 msec    Systemic VTI:  0.08 m MV E velocity: 89.50 cm/s  Systemic Diam: 2.20 cm MV A velocity: 60.80 cm/s MV E/A ratio:  1.47 Rachelle Hora Croitoru MD Electronically signed by Thurmon Fair MD Signature Date/Time: 10/02/2020/11:45:14 AM    Final     Cardiac Studies   Echo 10/02/20: 1. There is a protruding, but relatively fixed moderate size apical  thrombus (2.4 x 1 cm). Left ventricular ejection fraction, by estimation,  is 25 to 30%. The left ventricle has severely decreased function. The left   ventricle demonstrates global  hypokinesis. The left ventricular internal cavity size was severely  dilated. Left ventricular diastolic parameters are consistent with Grade  III diastolic dysfunction (restrictive). Elevated left atrial pressure.   2. Right ventricular systolic function is normal. The right ventricular  size is normal. Tricuspid regurgitation signal is inadequate for assessing  PA pressure.   3. Left atrial size was moderately dilated.   4. The mitral valve is normal in structure. Mild to moderate mitral valve  regurgitation.   5. The aortic valve is tricuspid. Aortic valve regurgitation is not  visualized. No aortic stenosis is present.   6. The inferior vena cava is normal in size with greater than 50%  respiratory variability, suggesting right atrial pressure of 3 mmHg.   Patient Profile     30 y.o. 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 Echo  this admission with EF now 25-30%, grade III DD  Assessment & Plan    Acute on chronic combined systolic and diastolic heart failure Diagnosed with severe LV systolic dysfunction 06/2018 at [facility] - EF 10-15%. CT coronary at that time showed no CAD. Cardiac MRI 07/16/18 with severe LV systolic dysfuction, small LV thrombus, mildly reduced RV, and evidence suggestive of myocarditis. Discharged with LifeVest, but stopped wearing it and declined ICD. Last saw a cardiologist in [facility] 10/2018.  - presented with CHF exacerbation - diuresed and overall net negative 5.7 L - I&Os incomplete - weight is  348 lbs, down from peak of 356 lbs  Current regimen includes: - 25 mg coreg BID - 24-26 mg entresto BID - 12.5 mg spiro daily - 20 mg lasix daily - 10 mg farxiga daily  - pressure marginal, unable to  titrate any medications this morning   LV apical thrombus Hx of DVT (2021) Need for chronic anticoagulation - LV thrombus initially seen on cardiac MRI 06/2018 but was not started on OAC - hx of DVT after immobility due to gout flare 2021 - was on eliquis - moved from [place] back to [facility] and PCP switched him to xarelto - questionable compliance - persistent LV thrombus --> switched to coumadin here with heparin bridge - INR 1.1 today   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 AHF clinic. I have also arranged INR follow up at [facility] on Wed.    Signed, Roe Rutherford Duke, PA  10/04/2020, 8:48 AM    For questions or updates, please contact [facility] Please consult [email or web address] for contact info under     Patient examined chart reviewed discussed care with patient and PA. Exam with obese black male distant heart sounds Lungs clear plus one edema. Discussed importance of coumadin f/u with INR for LV apical thrombus This appears chronic But large consider f/u cardiac MRI if he can fit in scanner after 3 months of anticoagulation On good GDMT and has outpatient F/u with coumadin clinic Wednesday and CHF clinic   Charlton Haws MD [facility]

## 2020-10-04 NOTE — Discharge Instructions (Addendum)

## 2020-10-04 NOTE — TOC Transition Note (Signed)
Transition of Care [facility]) - CM/SW Discharge Note   Patient Details  Name: Marcus Atkins MRN: [id number] Date of Birth: 14-Nov-1990  [facility] of [facility]) CM/SW Contact:  Ida Rogue, LCSW Phone Number: 10/04/2020, 12:59 PM   Clinical Narrative:   Patient who is stable for d/c is in need of follow up services related to thrombosis, CHF, Diabetes.  He will follow up with [facility] on Wednesday, [facility] on Friday, and [facility] for P[facility]. Also, [facility] is providing meds to bridge him to his Wednesday appointment, which includes [place] check.  No further needs identified.  TOC sign off.    Final next level of care: Home/Self Care Barriers to Discharge: No Barriers Identified   Patient Goals and CMS Choice        Discharge Placement                       Discharge Plan and Services                                     Social Determinants of Health (SDOH) Interventions     Readmission Risk Interventions No flowsheet data found.

## 2020-10-04 NOTE — Progress Notes (Addendum)
ANTICOAGULATION CONSULT NOTE - Follow Up Consult  Pharmacy Consult for Warfarin with Lovenox bridge Indication: Persistent LV thrombus  Allergies  Allergen Reactions  . Phenergan [Promethazine]   . Tramadol Itching    Patient Measurements: Height: 5\' 9"  (175.3 cm) Weight: (!) 158 kg (348 lb 5.2 oz) IBW/kg (Calculated) : 70.7 Heparin Dosing Weight: 105  Vital Signs: Temp: 97.5 F (36.4 C) (06/06 0450) Temp Source: Oral (06/06 0450) BP: 91/66 (06/06 0450) Pulse Rate: 95 (06/06 0450)  Labs: Recent Labs    10/01/20 1635 10/01/20 1841 10/02/20 0607 10/02/20 1350 10/02/20 2032 10/03/20 0621 10/04/20 0543  HGB 13.6  --  13.4  --   --  13.1 13.9  HCT 43.8  --  42.2  --   --  41.0 45.3  PLT 294  --  285  --   --  286 346  APTT  --   --   --  32 47* 57*  --   LABPROT  --   --   --  13.4  --  13.3 13.9  INR  --   --   --  1.0  --  1.0 1.1  HEPARINUNFRC  --   --   --  0.44  --  0.34  --   CREATININE  --   --  1.04  --   --  1.03 1.18  TROPONINIHS 129* 125*  --   --   --   --   --     Estimated Creatinine Clearance: 134.2 mL/min (by C-G formula based on SCr of 1.18 mg/dL).   Medical History: Past Medical History:  Diagnosis Date  . Gout   . Myocarditis (HCC)     Medications:  Xarelto 20 mg daily PTA  Assessment: 30 y/o M with a h/o chronic systolic and diastolic heart failure, LV thrombus (not initially started on anticoagulation in 2020), DVT (2021) started on Eliquis. Cardiology wants to convert patient to warfarin due to persistent LV thrombus despite Eliquis > Xarelto w/ evidence pointing towards better outcomes with warfarin than DOACs (though questionable compliance). Pharmacy consulted to initiate wararin dosing with IV UFH bridging. Last dose of VTE ppx Lovenox was last PM/early AM. Of note, patient is on allopurinol which may potentiate the effects of warfarin.   Goal of Therapy:  INR 2-3 LMWH anti-Xa level 0.6-1 units/mL   Plan:  Continue enoxaparin  160mg  [place] Q12h Give Coumadin 10mg  PO x 1 today before discharge  Discharge Plan for today: Coumadin Clinic follow up scheduled for 6/8 Continue Lovenox bridge of 150mg  [place] Q12h (for ease of giving full dose of prefilled syringe) and Coumadin 10mg  PO daily until follow up appointment on 6/8 where he will receive further instructions based on INR  , PharmD, BCPS, BCIDP Clinical Pharmacist 10/04/2020 9:58 AM

## 2020-10-05 ENCOUNTER — Telehealth (HOSPITAL_COMMUNITY): Payer: Self-pay | Admitting: Pharmacist

## 2020-10-05 ENCOUNTER — Other Ambulatory Visit: Payer: Self-pay

## 2020-10-05 ENCOUNTER — Other Ambulatory Visit (HOSPITAL_COMMUNITY): Payer: Self-pay

## 2020-10-05 DIAGNOSIS — I513 Intracardiac thrombosis, not elsewhere classified: Secondary | ICD-10-CM

## 2020-10-05 NOTE — Progress Notes (Addendum)
[facility] Transitions of Care [facility]  PCP: none Primary Cardiologist: Epifanio Lesches  HPI:  Marcus Atkins is a 30 y.o.  male  with a PMH significant for chronic systolic and diastolic heart failure, myocarditis, gout,    2020 he presented to hospital at [facility] [place] with chest pain.  Was found to be hypertensive and tachycardic with troponins up to 2226.  CTA was negative for aortic dissection.  EKG showed LVH.  Echocardiogram on 07/12/2018 showed LVEF 10 to 15% with moderately reduced RV function, no significant valvular disease.  Coronary CTA was done on 07/17/2018 which showed no evidence of CAD.  Cardiac MRI on 07/16/2018 showed severe LV systolic dysfunction (EF 14%), small LV thrombus, mildly reduced RV function (EF 41%), RV insertion site LGE, elevated T1/T2/ECV suggesting myocarditis.  He was discharged with LifeVest and set up to follow with advanced heart failure at [facility] [place].    From review of records it does not appear that he was started on anticoagulation for his LV thrombus, reasons unclear.  Repeat echocardiogram 08/2018 showed LVEF 15 to 20%.  He stopped wearing his LifeVest and declined ICD.  He was last seen by heart failure at [place] in 10/2018.  Reports he then moved to [place] and did not establish with a cardiologist.  While in [place] in 2021, he developed a DVT during a period of immobility from a gout flare.  He was started on Eliquis.  Reports he had been taking that until he moved back to [place] and establish with a new PCP who about 2 weeks ago switched him to Xarelto.  He reports he has not been compliant with his medicines, only taking about 3 times per week recently.   He presented to [facility] ED with increased dyspnea, lower extremity edema, no chest pain..  Initial vital signs notable for BP 128/114, pulse 122, SPO2 94% on room air.  Labs showed creatinine 1.2, sodium 143, potassium 3.1, BNP 127, procalcitonin undetectable,  troponin 129 > 125, D-dimer 0.64, COVID-19 negative, lower extremity duplex negative, CTPA showed no evidence of PE but extensive airspace opacities.  Echocardiogram 10/02/2020 LVEF 25 to 30%, grade 3 diastolic dysfunction, normal RV function, mild to moderate MR, LV apical thrombus. Started on IV lasix 20mg  daily, GDMT initiated d/ced on all 4 agents.  Decision made to switch to warfarin for persistent LV thrombus. Wt 356->348lbs  Feeling well since leaving the hospital.  Denies shortness of breath.  Walked from the parking garage to here today quickly walking and didn't get short of breath.  Denies chest pain, orthopnea or PND.  Not weighing himself at home doesn't have a scale.  Missed his morning medications today.     ROS: All systems negative except as listed in HPI, PMH and Problem List.  SH:  Social History   Socioeconomic History   Marital status: Single    Spouse name: Not on file   Number of children: Not on file   Years of education: Not on file   Highest education level: Not on file  Occupational History   Not on file  Tobacco Use   Smoking status: Never   Smokeless tobacco: Never  Vaping Use   Vaping Use: Never used  Substance and Sexual Activity   Alcohol use: Never   Drug use: Never   Sexual activity: Not on file  Other Topics Concern   Not on file  Social History Narrative   Not on file   Social Determinants  of Health   Financial Resource Strain: Not on file  Food Insecurity: Not on file  Transportation Needs: Not on file  Physical Activity: Not on file  Stress: Not on file  Social Connections: Not on file  Intimate Partner Violence: Not on file    FH:  Family History  Family history unknown: Yes    Past Medical History:  Diagnosis Date   Gout    Myocarditis (HCC)     Current Outpatient Medications  Medication Sig Dispense Refill   allopurinol (ZYLOPRIM) 300 MG tablet Take 300 mg by mouth 2 (two) times daily.     atorvastatin (LIPITOR) 80 MG  tablet Take 1 tablet by mouth daily.     carvedilol (COREG) 6.25 MG tablet Take 1 tablet (6.25 mg total) by mouth 2 (two) times daily with a meal. 60 tablet 0   dapagliflozin propanediol (FARXIGA) 10 MG TABS tablet Take 1 tablet (10 mg total) by mouth daily. 30 tablet 0   fluticasone (FLONASE) 50 MCG/ACT nasal spray Place 1 spray into both nostrils daily as needed.     furosemide (LASIX) 20 MG tablet Take 1 tablet (20 mg total) by mouth daily. 30 tablet 0   HYDROcodone-acetaminophen (NORCO/VICODIN) 5-325 MG tablet Take 1 tablet by mouth every 6 (six) hours as needed for severe pain.     insulin glargine (LANTUS) 100 UNIT/ML Solostar Pen Inject 20 Units into the skin daily. 10 mL 0   Insulin Pen Needle 32G X 4 MM MISC 1 each by Does not apply route 3 (three) times daily. 100 each 1   loratadine (CLARITIN) 10 MG tablet Take 10 mg by mouth daily as needed for allergies.     naproxen (NAPROSYN) 500 MG tablet Take 500 mg by mouth 2 (two) times daily as needed for moderate pain.     pantoprazole (PROTONIX) 40 MG tablet Take 40 mg by mouth daily as needed (heartburn).     predniSONE (DELTASONE) 10 MG tablet Take 1 tablet (10 mg total) by mouth daily. Take 4 pills tomorrow then 3 pills daily for 2 days then 2 pills daily for 2 days then 1 pill daily for 2 days then stop 16 tablet 0   sacubitril-valsartan (ENTRESTO) 24-26 MG Take 1 tablet by mouth 2 (two) times daily. 60 tablet 0   Semaglutide, 1 MG/DOSE, (OZEMPIC, 1 MG/DOSE,) 4 MG/3ML SOPN Inject 1 mg into the skin once a week. Mondays     spironolactone (ALDACTONE) 25 MG tablet Take 0.5 tablets (12.5 mg total) by mouth daily. 30 tablet 0   warfarin (COUMADIN) 10 MG tablet Take 1 tablet (10 mg total) by mouth daily. 60 tablet 1   No current facility-administered medications for this encounter.    Vitals:   10/08/20 1517  BP: 112/72  Pulse: (!) 113  SpO2: 98%  Weight: (!) 147.4 kg (325 lb)    PHYSICAL EXAM: Cardiac: JVD flat, normal rate and  rhythm, clear s1 and s2, no murmurs, rubs or gallops, no LE edema Pulmonary: CTAB, not in distress Abdominal: non distended abdomen, soft and nontender Psych: Alert, conversant, in good spirits    ECG  Sinus tachycardia, rate 109, LVH  ASSESSMENT & PLAN: Chronic Systolic and Diastolic CHF: -NICM, hx of myocarditis, HTN, poor medication adherence,  -Coronary CTA was done on 07/17/2018 which showed no evidence of CAD -Cardiac MRI on 07/16/2018 showed severe LV systolic dysfunction (EF 14%), small LV thrombus, mildly reduced RV function (EF 41%), RV insertion site LGE, elevated T1/T2/ECV suggesting  myocarditis -10/02/2020 LVEF 25 to 30%, grade 3 diastolic dysfunction, normal RV function, mild to moderate MR, LV apical thrombus -NYHA Class II, well compensated, euvolemic -missed morning meds, on excellent GDMT with entresto 24/26, spiro 12.5, jardiance 10, carvedilol 6.25 BID, lasix 20mg  daily -stop farxiga as he already picked up a 90 day supply of jardiance, switch lasix to PRN, given a scale today   Apical LV thrombus: -transitioned to warfarin as it was felt thrombus persisted despite DOAC therapy  Hx of DVT: -provoked in the setting of immobility from gout flare 2021 -now on warfarin as above  Nocturnal Bradycardia: Sinus Tachycardia: -did miss carvedilol today however apparently has sinus tach despite bb therapy -chmg will follow with 14 day monitor -agree with need for sleep study     Follow up with [facility] cardiology

## 2020-10-05 NOTE — Telephone Encounter (Signed)
Pharmacy Transitions of Care Follow-up Telephone Call  Date of discharge: 10/04/20  How have you been since you were released from the hospital? Good  Medication changes made at discharge:  - START: Warfarin and Enoxaparin  Medication changes verified by the patient? Yes   Medication Accessibility:  Home Pharmacy: In the process of establishing  Was the patient provided with refills on discharged medications? No   Is the patient able to afford medications? Yes . Notable copays: 0.00    Medication Review:  WARFARIN - Please verify dosing schedule and ensure they are scheduled to get INR checked at a clinic  ENOXAPARIN - Please verify dosing and anticipated length of therapy. Read discharge note to confirm date of completion and verify this with the patien  Follow-up Appointments:  PCP Hospital f/u appt confirmed? Yes Specialist Hospital f/u appt confirmed? Yes  If their condition worsens, is the pt aware to call PCP or go to the Emergency Dept.? Yes Final Patient Assessment: Reviewed new medications with patient.  Has follow up appointments tomorrow and Friday.  He will find out more about next steps.  Reports he is doing well.

## 2020-10-07 ENCOUNTER — Telehealth (HOSPITAL_COMMUNITY): Payer: Self-pay | Admitting: Licensed Clinical Social Worker

## 2020-10-07 NOTE — Telephone Encounter (Signed)
CSW contacted patient to remind of [facility] appointment. Patient confirmed appointment. Lasandra Beech, LCSW, CCSW-MCS [phone]

## 2020-10-08 ENCOUNTER — Ambulatory Visit (INDEPENDENT_AMBULATORY_CARE_PROVIDER_SITE_OTHER): Payer: No Typology Code available for payment source

## 2020-10-08 ENCOUNTER — Other Ambulatory Visit (HOSPITAL_COMMUNITY): Payer: Self-pay

## 2020-10-08 ENCOUNTER — Other Ambulatory Visit: Payer: Self-pay

## 2020-10-08 ENCOUNTER — Ambulatory Visit (HOSPITAL_COMMUNITY)
Admit: 2020-10-08 | Discharge: 2020-10-08 | Disposition: A | Discharge status: Home / Self Care | Payer: No Typology Code available for payment source | Attending: Internal Medicine | Admitting: Internal Medicine

## 2020-10-08 VITALS — BP 112/72 | HR 113 | Wt 325.0 lb

## 2020-10-08 DIAGNOSIS — R001 Bradycardia, unspecified: Secondary | ICD-10-CM | POA: Diagnosis not present

## 2020-10-08 DIAGNOSIS — R000 Tachycardia, unspecified: Secondary | ICD-10-CM

## 2020-10-08 DIAGNOSIS — I513 Intracardiac thrombosis, not elsewhere classified: Secondary | ICD-10-CM

## 2020-10-08 DIAGNOSIS — Z7901 Long term (current) use of anticoagulants: Secondary | ICD-10-CM

## 2020-10-08 DIAGNOSIS — Z79899 Other long term (current) drug therapy: Secondary | ICD-10-CM | POA: Diagnosis not present

## 2020-10-08 DIAGNOSIS — Z95811 Presence of heart assist device: Secondary | ICD-10-CM | POA: Insufficient documentation

## 2020-10-08 DIAGNOSIS — Z86718 Personal history of other venous thrombosis and embolism: Secondary | ICD-10-CM | POA: Diagnosis not present

## 2020-10-08 DIAGNOSIS — I110 Hypertensive heart disease with heart failure: Secondary | ICD-10-CM | POA: Insufficient documentation

## 2020-10-08 DIAGNOSIS — I5042 Chronic combined systolic (congestive) and diastolic (congestive) heart failure: Secondary | ICD-10-CM | POA: Diagnosis not present

## 2020-10-08 DIAGNOSIS — E66813 Obesity, class 3: Secondary | ICD-10-CM

## 2020-10-08 DIAGNOSIS — Z5181 Encounter for therapeutic drug level monitoring: Secondary | ICD-10-CM

## 2020-10-08 DIAGNOSIS — I428 Other cardiomyopathies: Secondary | ICD-10-CM | POA: Insufficient documentation

## 2020-10-08 LAB — POCT INR: INR: 1.6 — AB (ref 2.0–3.0)

## 2020-10-08 MED ORDER — EMPAGLIFLOZIN 10 MG PO TABS: 10.0000 mg | ORAL_TABLET | Freq: Every day | ORAL | 11 refills | Status: AC

## 2020-10-08 MED ORDER — FUROSEMIDE 20 MG PO TABS
20.0000 mg | ORAL_TABLET | Freq: Every day | ORAL | 2 refills | Status: AC | PRN
Start: 2020-10-08 — End: ?

## 2020-10-08 MED ORDER — WARFARIN SODIUM 10 MG PO TABS: 10.0000 mg | ORAL_TABLET | Freq: Every day | ORAL | 1 refills | Status: DC

## 2020-10-08 NOTE — Progress Notes (Signed)
  Heart and Vascular Care Navigation  10/08/2020  Jourden Gilson December 19, 1990 [id number]  Reason for Referral: Patient seen in HF TOC.    Engaged with patient face to face for initial visit for Heart and Vascular Care Coordination.                                                                                                   Assessment:    Patient is a 30yo male who lives with his brother. Patient reports he works part time and has insurance through [employer]. Patient states he is able to afford his medications and he reports he has good prescription coverage.    Patient denies any SDoH needs at this time.                                HRT/VAS Care Coordination     Patients Home Cardiology Office --  [facility]   Outpatient Care Team Social Worker   Social Worker Name: Lasandra Beech, [place] [phone]   Living arrangements for the past 2 months Single Family Home   Lives with: Siblings   Patient Current Insurance Coverage Commercial Insurance  AHA   Patient Has Concern With Paying Medical Bills No   Does Patient Have Prescription Coverage? Yes   Home Assistive Devices/Equipment None       Social History:                                                                             SDOH Screenings   Alcohol Screen: Not on file  Depression (PHQ2-9): Not on file  Financial Resource Strain: Low Risk    Difficulty of Paying Living Expenses: Not hard at all  Food Insecurity: No Food Insecurity   Worried About Programme researcher, broadcasting/film/video in the Last Year: Never true   Ran Out of Food in the Last Year: Never true  Housing: Low Risk    Last Housing Risk Score: 0  Physical Activity: Not on file  Social Connections: Not on file  Stress: Not on file  Tobacco Use: Low Risk    Smoking Tobacco Use: Never   Smokeless Tobacco Use: Never  Transportation Needs: No Transportation Needs   Lack of Transportation (Medical): No   Lack of Transportation (Non-Medical): No    Follow-up plan:  CSW  provided a scale and Heart Failure education booklet. Patient appears to be motivated for improved health and denies any other concerns. Lasandra Beech, LCSW, CCSW-MCS [phone]

## 2020-10-08 NOTE — Patient Instructions (Signed)
Take 1 tablet Daily.  INR 1 week 6/13.  A full discussion of the nature of anticoagulants has been carried out.  A benefit risk analysis has been presented to the patient, so that they understand the justification for choosing anticoagulation at this time. The need for frequent and regular monitoring, precise dosage adjustment and compliance is stressed.  Side effects of potential bleeding are discussed.  The patient should avoid any OTC items containing aspirin or ibuprofen, and should avoid great swings in general diet.  Avoid alcohol consumption.  Call if any signs of abnormal bleeding.  [phone]

## 2020-10-08 NOTE — Patient Instructions (Signed)
No Labs done today.   STOP taking Farxiga  START Jardiance 10mg  (1 tablet) by mouth daily.   DECREASE Lasix to 20mg  (1 tablet) by mouth daily as neeeded.  No other medication changes were made. Please continue all current medications as prescribed.  You have been referred to [facility]iology at [facility]. They will contact you to schedule an appointment.

## 2020-10-12 ENCOUNTER — Other Ambulatory Visit: Payer: Self-pay

## 2020-10-12 ENCOUNTER — Encounter (HOSPITAL_COMMUNITY): Payer: No Typology Code available for payment source

## 2020-10-12 MED ORDER — WARFARIN SODIUM 5 MG PO TABS: ORAL_TABLET | ORAL | 0 refills | Status: AC

## 2020-10-12 NOTE — Telephone Encounter (Signed)
Called and spoke w/pt regarding the pharmacy's issue of not having 10mg  warfarin so he agreed to do the 2 5mg  instead I sent new rx but there needs to be an addendum to the coumadin note since we changed the amount of pills will route to [facility]quel [facility] pharmd

## 2020-10-13 ENCOUNTER — Telehealth: Payer: Self-pay

## 2020-10-13 NOTE — Telephone Encounter (Signed)
Unable to lMom to r/s missed inr appt

## 2020-11-05 ENCOUNTER — Ambulatory Visit: Payer: No Typology Code available for payment source | Admitting: Nurse Practitioner

## 2020-11-23 ENCOUNTER — Telehealth: Payer: Self-pay | Admitting: Cardiology

## 2020-11-23 NOTE — Telephone Encounter (Signed)
Patient is scheduled to see Dr. Bjorn Pippin at [facility] office on 07/28 and his coumadin appt is scheduled at [facility] for right after but not sure if pt is going to have time to get to [facility] office in time. Please advise.

## 2020-11-23 NOTE — Telephone Encounter (Signed)
called pt to let him know I cancelled coumadin and he would need to call us to reschedule but I was unable to lmom

## 2020-11-24 NOTE — Progress Notes (Deleted)
Cardiology Office Note:    Date:  11/24/2020   ID:  Marcus Atkins, DOB [birth year], MRN [id number]  PCP:  Pcp, No  Cardiologist:  Little Ishikawa, MD  Electrophysiologist:  None   Referring MD: Angelita Ingles, MD   No chief complaint on file. ***  History of Present Illness:    Marcus Atkins is a 30 y.o. male with a hx of with a history of chronic systolic and diastolic heart failure.  He previously followed with advanced heart failure at the [facility] [place] and moved to [facility] May 2022.  In March 2020 he presented to hospital at [facility] [place] with chest pain.  Was found to be hypertensive and tachycardic with troponins up to 2226.  CTA was negative for aortic dissection.  EKG showed LVH.  Echocardiogram on 07/12/2018 showed LVEF 10 to 15% with moderately reduced RV function, no significant valvular disease.  Coronary CTA was done on 07/17/2018 which showed no evidence of CAD.  Cardiac MRI on 07/16/2018 showed severe LV systolic dysfunction (EF 14%), small LV thrombus, mildly reduced RV function (EF 41%), RV insertion site LGE, elevated T1/T2/ECV suggesting myocarditis.  He was discharged with LifeVest and set up to follow with advanced heart failure at [facility] [place].  From review of records it does not appear that he was started on anticoagulation for his LV thrombus, reasons unclear.  Repeat echocardiogram 08/2018 showed LVEF 15 to 20%.  He stopped wearing his LifeVest and declined ICD.  He was last seen by heart failure at [place] in 10/2018.  Reports he then moved to [place] and did not establish with a cardiologist.  While in [place] in 2021, he developed a DVT during a period of immobility from a gout flare.  He was started on Eliquis.  Reports he had been taking that until he moved back to [place] and establish with a new PCP who about in May 2022 switched him to Xarelto.  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  He was admitted to  [facility] on 10/01/2020 with her extremity edema and shortness of breath.  LVEF 25 to 30%, grade 3 diastolic dysfunction, normal RV function, mild to moderate MR, LV apical thrombus.  He improved with IV diuresis and was transitioned to p.o. Lasix.  He was discharged on Entresto, carvedilol, Farxiga, and spironolactone.  For his LV thrombus, given lack of resolution on Eliquis and Xarelto, recommended switching to warfarin.    Past Medical History:  Diagnosis Date   Gout    Myocarditis (HCC)     No past surgical history on file.  Current Medications: No outpatient medications have been marked as taking for the 11/25/20 encounter (Appointment) with Little Ishikawa, MD.     Allergies:   Phenergan [promethazine] and Tramadol   Social History   Socioeconomic History   Marital status: Single    Spouse name: Not on file   Number of children: Not on file   Years of education: Not on file   Highest education level: Not on file  Occupational History   Not on file  Tobacco Use   Smoking status: Never   Smokeless tobacco: Never  Vaping Use   Vaping Use: Never used  Substance and Sexual Activity   Alcohol use: Never   Drug use: Never   Sexual activity: Not on file  Other Topics Concern   Not on file  Social History Narrative   Not on file  Social Determinants of Health   Financial Resource Strain: Low Risk    Difficulty of Paying Living Expenses: Not hard at all  Food Insecurity: No Food Insecurity   Worried About Programme researcher, broadcasting/film/video in the Last Year: Never true   Ran Out of Food in the Last Year: Never true  Transportation Needs: No Transportation Needs   Lack of Transportation (Medical): No   Lack of Transportation (Non-Medical): No  Physical Activity: Not on file  Stress: Not on file  Social Connections: Not on file     Family History: The patient's ***Family history is unknown by patient.  ROS:   Please see the history of present illness.    *** All other  systems reviewed and are negative.  EKGs/Labs/Other Studies Reviewed:    The following studies were reviewed today: ***  EKG:  EKG is *** ordered today.  The ekg ordered today demonstrates ***  Recent Labs: 10/01/2020: B Natriuretic Peptide 127.4 10/03/2020: Magnesium 1.9 10/04/2020: BUN 20; Creatinine, Ser 1.18; Hemoglobin 13.9; Platelets 346; Potassium 4.1; Sodium 142  Recent Lipid Panel No results found for: CHOL, TRIG, HDL, CHOLHDL, VLDL, LDLCALC, LDLDIRECT  Physical Exam:    VS:  There were no vitals taken for this visit.    Wt Readings from Last 3 Encounters:  10/08/20 (!) 325 lb (147.4 kg)  10/04/20 (!) 348 lb 5.2 oz (158 kg)  09/23/20 (!) 320 lb (145.2 kg)     GEN: *** Well nourished, well developed in no acute distress HEENT: Normal NECK: No JVD; No carotid bruits LYMPHATICS: No lymphadenopathy CARDIAC: ***RRR, no murmurs, rubs, gallops RESPIRATORY:  Clear to auscultation without rales, wheezing or rhonchi  ABDOMEN: Soft, non-tender, non-distended MUSCULOSKELETAL:  No edema; No deformity  SKIN: Warm and dry NEUROLOGIC:  Alert and oriented x 3 PSYCHIATRIC:  Normal affect   ASSESSMENT:    No diagnosis found. PLAN:    Acute on chronic combined systolic and diastolic heart failure: Diagnosed with severe LV systolic dysfunction in March 2020 at [place] [facility] [place], LVEF 10 to 15% at that time.  Coronary CTA showed no CAD.  Cardiac MRI Cardiac MRI on 07/16/2018 showed severe LV systolic dysfunction (EF 14%), small LV thrombus, mildly reduced RV function (EF 41%), RV insertion site LGE, elevated T1/T2/ECV suggesting myocarditis.  He was discharged with LifeVest but stopped wearing and declined ICD.  Followed with advanced heart failure in [place] of [place], last seen 10/2018.  Has not seen a cardiologist since that time.  Moved to [place] 1 week ago.  Reported onset of cough/shortness of breath and lower extremity edema a few days ago.  Echocardiogram today shows LVEF 25  to 30%, grade 3 diastolic dysfunction, normal RV function, mild to moderate MR, LV apical thrombus. -Continue p.o. Lasix 20 mg daily as needed for weight gain greater than 3 pounds 1 day or 5 pounds 1 week -Continue carvedilol 6.25 mg twice daily -Continue Entresto 24-26 mg twice daily -Continue Jardiance 10 mg daily -Continue spironolactone 12.5 mg daily -Will refer to advanced Heart Failure    LV apical thrombus: Initially seen on cardiac MRI 06/2018, but appears was not started on anticoagulation at that time.  While in [place] in 2021, he developed a DVT during a period of immobility from a gout flare.  He was started on Eliquis.  Reports he had been taking Eliquis until he moved back to [place] and established with a new PCP who in May 2022 switched him to Xarelto -Given persistent LV thrombus  despite taking Eliquis last year and recently switched to Xarelto (though questionable compliance), and given there is some evidence that warfarin is associated with better outcomes for LV thrombus than DOACs, he was switched to warfarin.  Follows in Coumadin clinic  Bradycardia: sinus tachycardia during day, noted to have bradyarrhythmia overnight, had 2 second pause last night and Wenckebach the previous night.  Suspect untreated OSA.  Continue to monitor, will need outpatient sleep study and likely monitor on discharge.   Medication Adjustments/Labs and Tests Ordered: Current medicines are reviewed at length with the patient today.  Concerns regarding medicines are outlined above.  No orders of the defined types were placed in this encounter.  No orders of the defined types were placed in this encounter.   There are no Patient Instructions on file for this visit.   Signed, Little Ishikawa, MD  11/24/2020 11:35 PM    [facility]

## 2020-11-25 ENCOUNTER — Telehealth: Payer: Self-pay | Admitting: *Deleted

## 2020-11-25 ENCOUNTER — Ambulatory Visit (HOSPITAL_BASED_OUTPATIENT_CLINIC_OR_DEPARTMENT_OTHER): Payer: No Typology Code available for payment source | Admitting: Cardiology

## 2020-11-25 NOTE — Telephone Encounter (Signed)
Attempt to call patient to reschedule appt with Dr. Bjorn Pippin (no show 7/28) Unable to leave VM-VM full.

## 2020-12-02 ENCOUNTER — Telehealth: Payer: Self-pay

## 2020-12-02 NOTE — Telephone Encounter (Signed)
Unable to lmom for overdue inr as the mailbox was full

## 2020-12-06 NOTE — Telephone Encounter (Signed)
Attempt to contact patient to reschedule appt.  No answer and unable to leave VM/VM full

## 2020-12-10 ENCOUNTER — Telehealth: Payer: Self-pay

## 2020-12-10 NOTE — Telephone Encounter (Signed)
Called and was unable to lmom as voicemail was full for overdue inr

## 2020-12-10 NOTE — Telephone Encounter (Addendum)
Unable to reach patient to reschedule.   Letter created to mail but address listed is [place]?   Will continue to reach patient by phone.

## 2020-12-13 ENCOUNTER — Telehealth: Payer: Self-pay | Admitting: Cardiology

## 2020-12-13 NOTE — Telephone Encounter (Signed)
Called patient as he has been a no show to follow-up appointments including INR checks.  He reports he moved to [place] and is following with cardiology there now.

## 2020-12-13 NOTE — Telephone Encounter (Signed)
This encounter was created in error - please disregard.

## 2022-12-05 IMAGING — CT CT ANGIO CHEST
2 of 6 series · 18 of 36 positions shown · IV contrast (omnipaque)
Comparison: None.

CLINICAL DATA: PE suspected, shortness of breath, hemoptysis

EXAM:
CT ANGIOGRAPHY CHEST WITH CONTRAST
TECHNIQUE: Multidetector CT imaging of the chest was performed using the
standard protocol during bolus administration of intravenous
contrast. Multiplanar CT image reconstructions and MIPs were
obtained to evaluate the vascular anatomy.
CONTRAST:  80mL OMNIPAQUE IOHEXOL 350 MG/ML SOLN

[Series 5: thins · axial · 0.86mm/px · z∈[-285,-50]mm · 17 of 265 slices shown]
[im 15/265  lung]
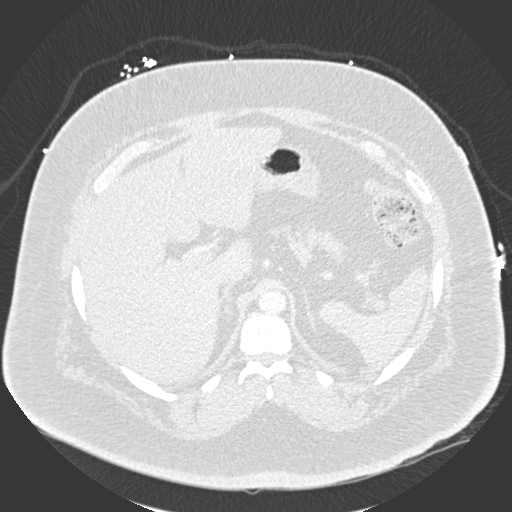
[im 30/265  mediastinal]
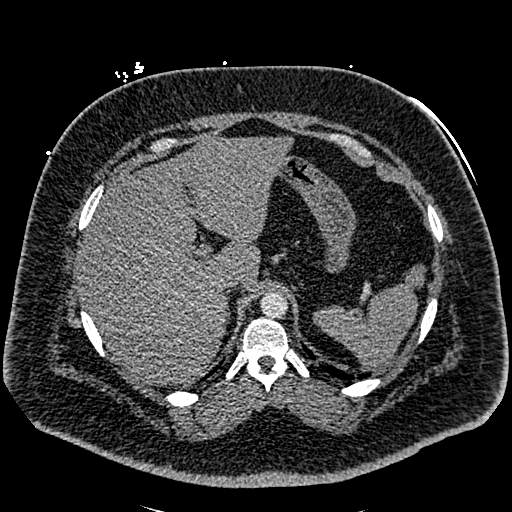
[im 45/265  lung]
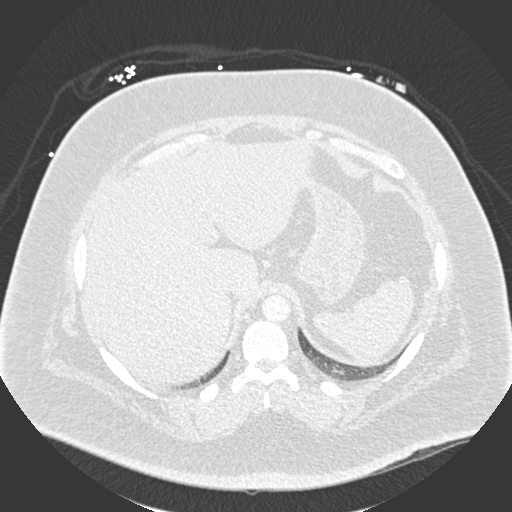
[im 59/265  mediastinal]
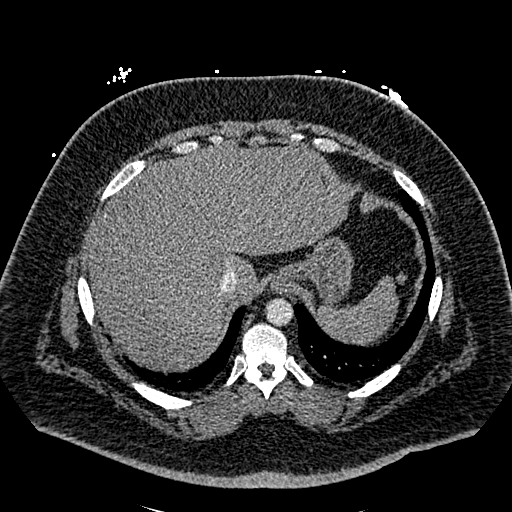
[im 74/265  lung]
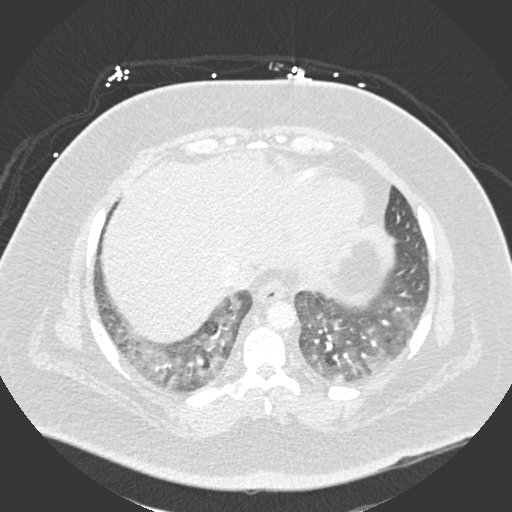
[im 89/265  mediastinal]
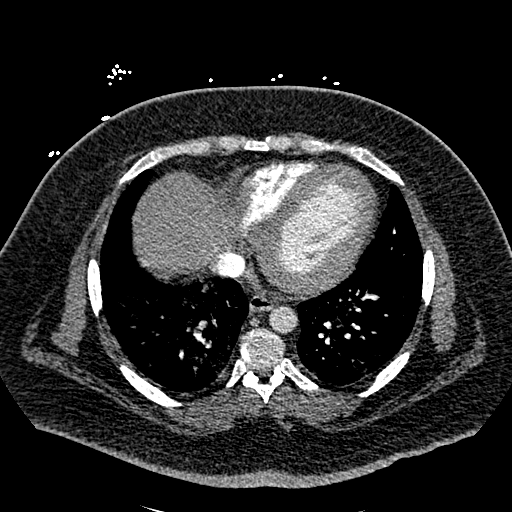
[im 103/265  lung]
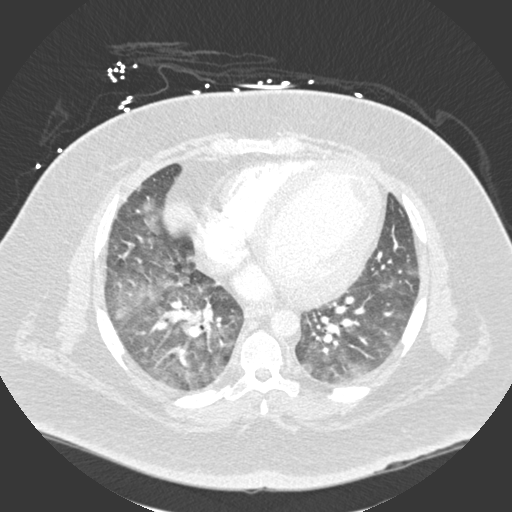
[im 118/265  mediastinal]
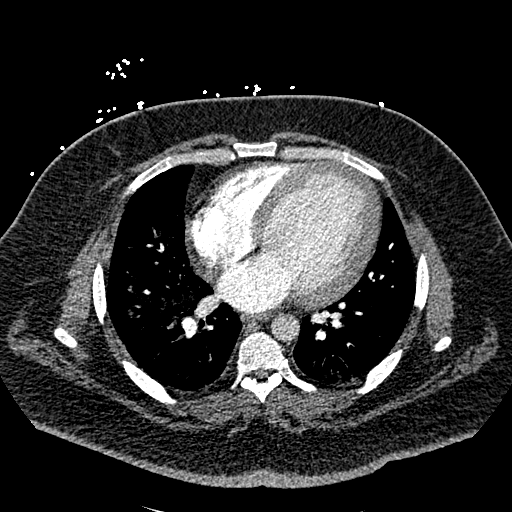
[im 133/265  lung]
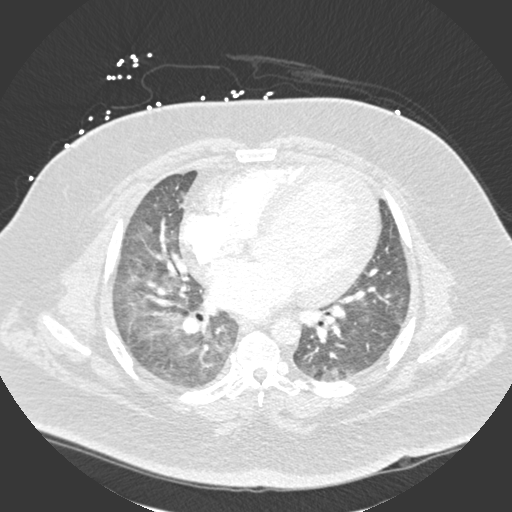
[im 147/265  mediastinal]
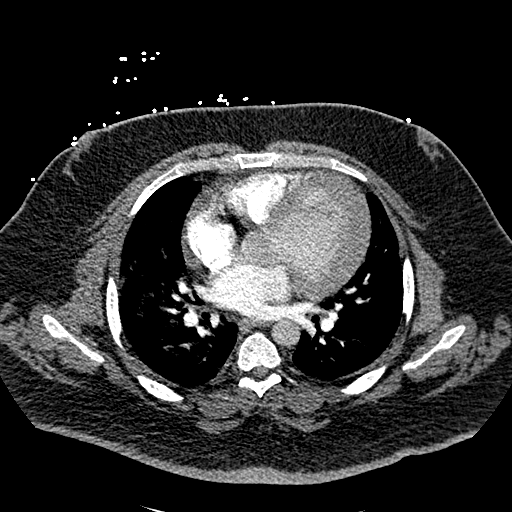
[im 162/265  lung]
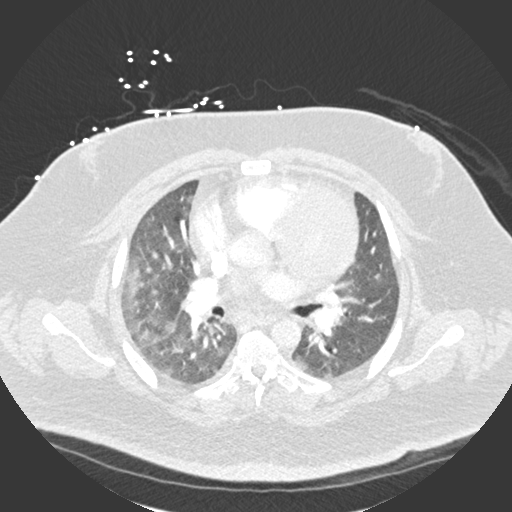
[im 177/265  mediastinal]
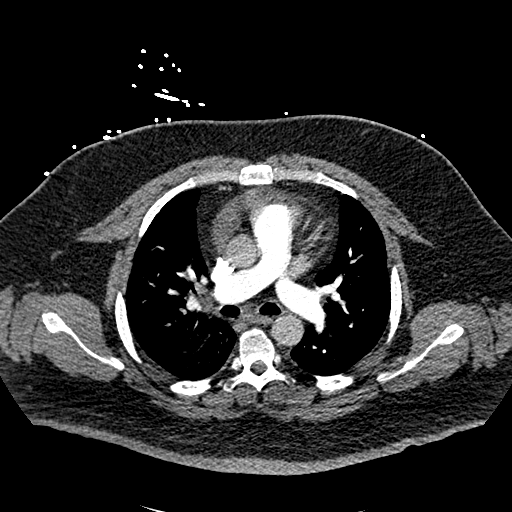
[im 191/265  lung]
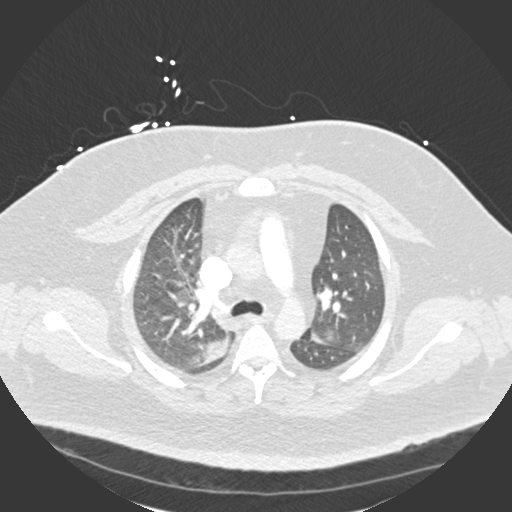
[im 206/265  mediastinal]
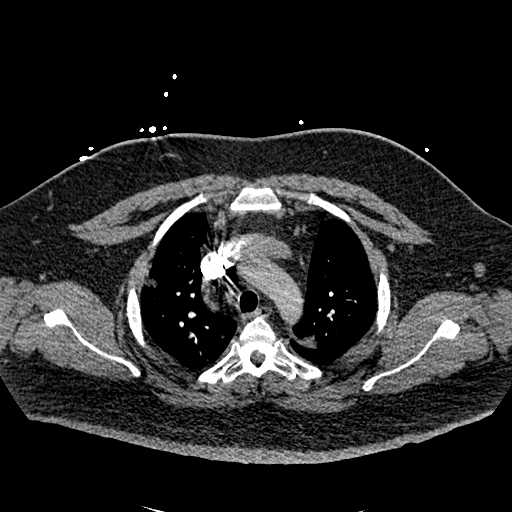
[im 221/265  lung]
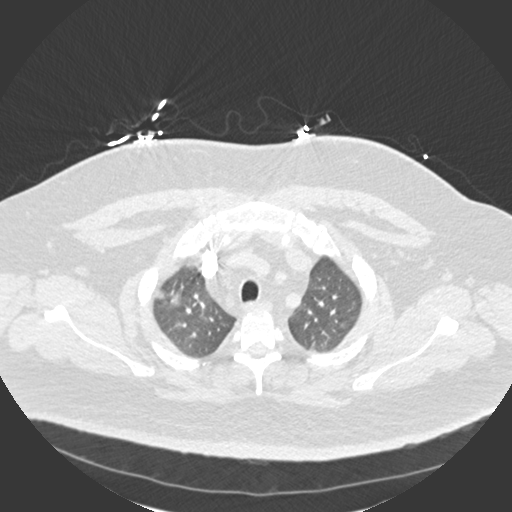
[im 235/265  mediastinal]
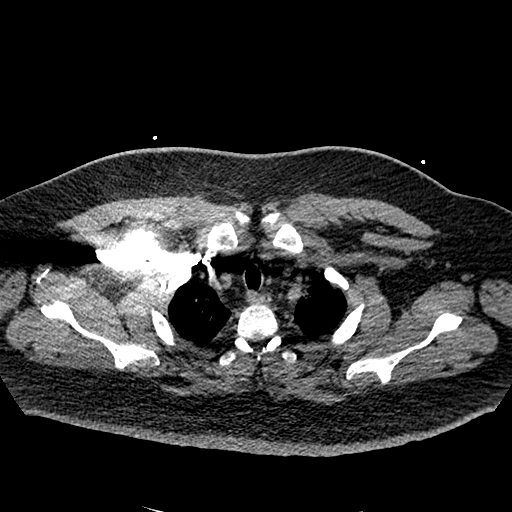
[im 250/265  lung]
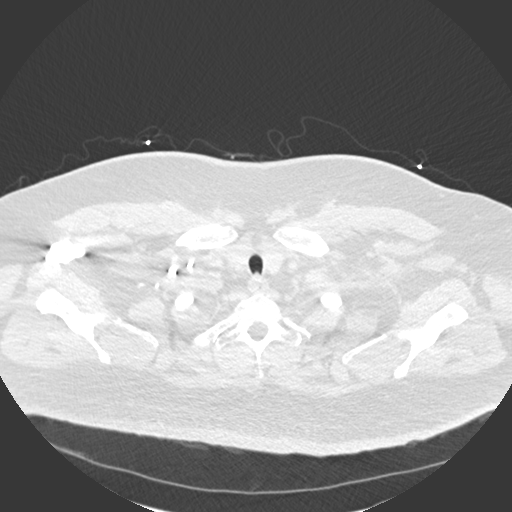

[Series 7: coronal mpr · coronal · 0.57mm/px · 1 of 189 slices shown]
[im 95/189  mediastinal]
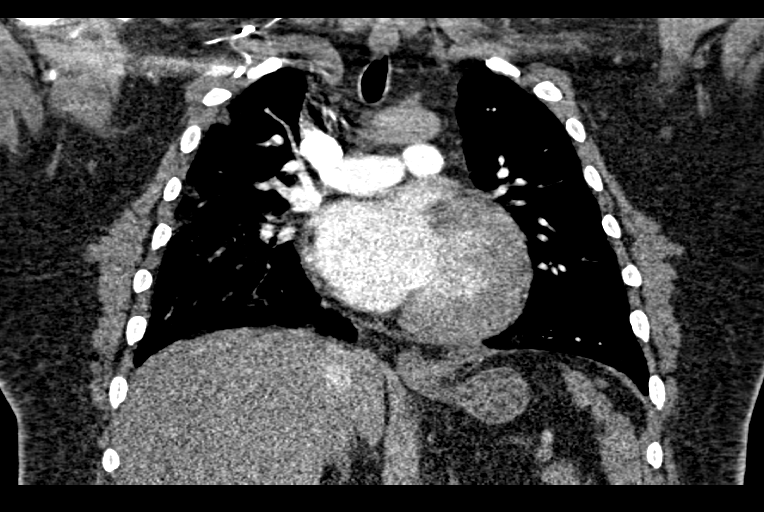

[18 of 36 positions shown; findings below may reference images not displayed]

FINDINGS: Cardiovascular: Satisfactory opacification of the pulmonary arteries
to the segmental level. No evidence of pulmonary embolism.
Cardiomegaly. No pericardial effusion.

Mediastinum/Nodes: No enlarged mediastinal, hilar, or axillary lymph
nodes. Thyroid gland, trachea, and esophagus demonstrate no
significant findings.

Lungs/Pleura: Extensive bilateral heterogeneous and ground-glass
airspace opacity throughout the lungs, with a bibasilar
predominance. No pleural effusion or pneumothorax.

Upper Abdomen: No acute abnormality.

Musculoskeletal: No chest wall abnormality. No acute or significant
osseous findings.

Review of the MIP images confirms the above findings.
IMPRESSION: 1. Negative examination for pulmonary embolism.
2. Extensive bilateral heterogeneous and ground-glass airspace
opacity throughout the lungs, with a bibasilar predominance.
Findings are consistent with multifocal infection, including
[id number] if clinically suspected.
3. Cardiomegaly.

## 2024-03-01 DEATH — deceased
